# Patient Record
Sex: Male | Born: 1975 | Race: White | Hispanic: No | Marital: Single | Smoking: Current every day smoker
Health system: Southern US, Community
[De-identification: ages and names within clinical notes are randomized; demographics above are authoritative.]

## PROBLEM LIST (undated history)

## (undated) DIAGNOSIS — N189 Chronic kidney disease, unspecified: Secondary | ICD-10-CM

## (undated) DIAGNOSIS — Z720 Tobacco use: Secondary | ICD-10-CM

## (undated) DIAGNOSIS — K219 Gastro-esophageal reflux disease without esophagitis: Secondary | ICD-10-CM

## (undated) HISTORY — PX: ORIF TIBIA PLATEAU: SHX2132

## (undated) HISTORY — PX: APPENDECTOMY: SHX54

---

## 1998-11-11 ENCOUNTER — Emergency Department (HOSPITAL_COMMUNITY): Admission: EM | Admit: 1998-11-11 | Discharge: 1998-11-11 | Payer: Self-pay | Admitting: Emergency Medicine

## 1998-11-11 ENCOUNTER — Encounter: Payer: Self-pay | Admitting: Emergency Medicine

## 1998-12-18 ENCOUNTER — Encounter: Payer: Self-pay | Admitting: Emergency Medicine

## 1998-12-18 ENCOUNTER — Emergency Department (HOSPITAL_COMMUNITY): Admission: EM | Admit: 1998-12-18 | Discharge: 1998-12-18 | Payer: Self-pay | Admitting: Emergency Medicine

## 1999-11-02 ENCOUNTER — Emergency Department (HOSPITAL_COMMUNITY): Admission: EM | Admit: 1999-11-02 | Discharge: 1999-11-02 | Payer: Self-pay | Admitting: Emergency Medicine

## 2001-06-13 ENCOUNTER — Emergency Department (HOSPITAL_COMMUNITY): Admission: EM | Admit: 2001-06-13 | Discharge: 2001-06-13 | Payer: Self-pay | Admitting: Emergency Medicine

## 2001-06-17 ENCOUNTER — Emergency Department (HOSPITAL_COMMUNITY): Admission: EM | Admit: 2001-06-17 | Discharge: 2001-06-17 | Payer: Self-pay | Admitting: Emergency Medicine

## 2003-10-22 ENCOUNTER — Emergency Department (HOSPITAL_COMMUNITY): Admission: EM | Admit: 2003-10-22 | Discharge: 2003-10-22 | Payer: Self-pay | Admitting: Emergency Medicine

## 2008-01-05 ENCOUNTER — Emergency Department (HOSPITAL_COMMUNITY): Admission: EM | Admit: 2008-01-05 | Discharge: 2008-01-05 | Payer: Self-pay | Admitting: Emergency Medicine

## 2008-01-09 ENCOUNTER — Emergency Department (HOSPITAL_COMMUNITY): Admission: EM | Admit: 2008-01-09 | Discharge: 2008-01-09 | Payer: Self-pay | Admitting: Emergency Medicine

## 2011-10-30 ENCOUNTER — Emergency Department (HOSPITAL_COMMUNITY)
Admission: EM | Admit: 2011-10-30 | Discharge: 2011-10-30 | Disposition: A | Discharge status: Home / Self Care | Payer: Self-pay | Attending: Emergency Medicine | Admitting: Emergency Medicine

## 2011-10-30 ENCOUNTER — Encounter (HOSPITAL_COMMUNITY): Payer: Self-pay | Admitting: *Deleted

## 2011-10-30 ENCOUNTER — Emergency Department (HOSPITAL_COMMUNITY): Payer: Self-pay

## 2011-10-30 DIAGNOSIS — M25519 Pain in unspecified shoulder: Secondary | ICD-10-CM | POA: Insufficient documentation

## 2011-10-30 DIAGNOSIS — X500XXA Overexertion from strenuous movement or load, initial encounter: Secondary | ICD-10-CM | POA: Insufficient documentation

## 2011-10-30 DIAGNOSIS — S161XXA Strain of muscle, fascia and tendon at neck level, initial encounter: Secondary | ICD-10-CM

## 2011-10-30 DIAGNOSIS — W06XXXA Fall from bed, initial encounter: Secondary | ICD-10-CM | POA: Insufficient documentation

## 2011-10-30 DIAGNOSIS — S139XXA Sprain of joints and ligaments of unspecified parts of neck, initial encounter: Secondary | ICD-10-CM | POA: Insufficient documentation

## 2011-10-30 DIAGNOSIS — M542 Cervicalgia: Secondary | ICD-10-CM | POA: Insufficient documentation

## 2011-10-30 MED ORDER — PREDNISONE 50 MG PO TABS: ORAL_TABLET | ORAL | Status: AC

## 2011-10-30 MED ORDER — CYCLOBENZAPRINE HCL 10 MG PO TABS: ORAL_TABLET | ORAL | Status: DC

## 2011-10-30 MED ORDER — PREDNISONE 20 MG PO TABS
60.0000 mg | ORAL_TABLET | Freq: Once | ORAL | Status: AC
  Administered 2011-10-30: 60 mg via ORAL
  Filled 2011-10-30: qty 3

## 2011-10-30 MED ORDER — CYCLOBENZAPRINE HCL 10 MG PO TABS
10.0000 mg | ORAL_TABLET | Freq: Once | ORAL | Status: AC
  Administered 2011-10-30: 10 mg via ORAL
  Filled 2011-10-30: qty 1

## 2011-10-30 NOTE — ED Provider Notes (Signed)
History     CSN: [id number]  Arrival date & time 10/30/11  4098   First MD Initiated Contact with Patient 10/30/11 1030      Chief Complaint  Patient presents with  . Neck Pain    (Consider location/radiation/quality/duration/timing/severity/associated sxs/prior treatment) Patient is a 36 y.o. Mclaughlin presenting with neck pain. The history is provided by the patient. No language interpreter was used.  Neck Pain  This is a new problem. Episode onset: 3 days ago. The problem occurs constantly. The problem has been gradually worsening. The pain is associated with lifting a heavy object (initial neck pain started when he attempted sitting on his bed and "missed it".  struck floor with buttocks and "whiplashed my neck".  pain is worse since lifting a heavy appliance at work yest.). There has been no fever. The pain is present in the right side. The quality of the pain is described as shooting and aching. The pain radiates to the right scapula and right shoulder. The pain is severe. The symptoms are aggravated by bending and twisting. He has tried NSAIDs for the symptoms. The treatment provided no relief.    History reviewed. No pertinent past medical history.  Past Surgical History  Procedure Date  . Appendectomy     History reviewed. No pertinent family history.  History  Substance Use Topics  . Smoking status: Current Everyday Smoker -- 2.0 packs/day    Types: Cigarettes  . Smokeless tobacco: Not on file  . Alcohol Use: Yes      Review of Systems  HENT: Positive for neck pain.   Musculoskeletal:       Neck pain  All other systems reviewed and are negative.    Allergies  Review of patient's allergies indicates no known allergies.  Home Medications   Current Outpatient Rx  Name Route Sig Dispense Refill  . NAPROXEN SODIUM 220 MG PO TABS Oral Take 220 mg by mouth 2 (two) times daily as needed. Pain    . CYCLOBENZAPRINE HCL 10 MG PO TABS  1/2 to one tab po TID for muscle  strain 20 tablet 0  . PREDNISONE 50 MG PO TABS  One po QD 6 tablet 0    BP 118/71  Pulse 78  Temp(Src) 98.2 F (36.8 C) (Oral)  Resp 20  Ht 6\' 1"  (1.854 m)  Wt 180 lb (81.647 kg)  BMI 23.75 kg/m2  SpO2 99%  Physical Exam  Nursing note and vitals reviewed. Constitutional: He is oriented to person, place, and time. He appears well-developed and well-nourished.  HENT:  Head: Normocephalic and atraumatic.  Eyes: EOM are normal.  Neck: Muscular tenderness present. No spinous process tenderness present. No rigidity. Decreased range of motion present.    Cardiovascular: Normal rate, regular rhythm, normal heart sounds and intact distal pulses.   Pulmonary/Chest: Effort normal and breath sounds normal. No respiratory distress.  Abdominal: Soft. He exhibits no distension. There is no tenderness.  Musculoskeletal: He exhibits tenderness.  Neurological: He is alert and oriented to person, place, and time. He has normal strength. He displays normal reflexes. No cranial nerve deficit or sensory deficit. Coordination normal. GCS eye subscore is 4. GCS verbal subscore is 5. GCS motor subscore is 6.  Reflex Scores:      Tricep reflexes are 2+ on the right side and 2+ on the left side.      Bicep reflexes are 2+ on the right side and 2+ on the left side.      Brachioradialis  reflexes are 2+ on the right side and 2+ on the left side. Skin: Skin is warm and dry.  Psychiatric: He has a normal mood and affect. Judgment normal.    ED Course  Procedures (including critical care time)  Labs Reviewed - No data to display Dg Cervical Spine Complete  10/30/2011  *RADIOLOGY REPORT*  Clinical Data: Posterior neck pain for 1 week.  Had whiplash type injury 1 week ago.  CERVICAL SPINE - COMPLETE 4+ VIEW  Comparison: None.  Findings: There is reversal of the normal cervical lordosis.  The cervical spine vertebral bodies are normally aligned from the skull base through the T2 vertebral body.  Vertebral  bodies are normal in height.  The facet joints are aligned.  The disc spaces are maintained and the neural foramen are patent bilaterally.  Lateral masses of C1-C2 are aligned.  No acute fracture is identified. Prevertebral soft tissue contour is within normal limits.  The tracheal column is unremarkable.  IMPRESSION:  1.  Reversal of the normal cervical lordosis.  This can be seen in the setting of muscle spasm. 2.  No acute bony abnormality identified.  Original Report Authenticated By: Britta Mccreedy, M.D.     1. Cervical strain       MDM  rx prednisone rx flexeril Ice F/u with dr. Romeo Apple prn        Worthy Rancher, PA 10/30/11 1220  Worthy Rancher, PA 10/30/11 1220
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
## 2011-10-30 NOTE — Discharge Instructions (Signed)
Cryotherapy Cryotherapy means treatment with cold. Ice or gel packs can be used to reduce both pain and swelling. Ice is the most helpful within the first 24 to 48 hours after an injury or flareup from overusing a muscle or joint. Sprains, strains, spasms, burning pain, shooting pain, and aches can all be eased with ice. Ice can also be used when recovering from surgery. Ice is effective, has very few side effects, and is safe for most people to use. PRECAUTIONS  Ice is not a safe treatment option for people with:  Raynaud's phenomenon. This is a condition affecting small blood vessels in the extremities. Exposure to cold may cause your problems to return.   Cold hypersensitivity. There are many forms of cold hypersensitivity, including:   Cold urticaria. Red, itchy hives appear on the skin when the tissues begin to warm after being iced.   Cold erythema. This is a red, itchy rash caused by exposure to cold.   Cold hemoglobinuria. Red blood cells break down when the tissues begin to warm after being iced. The hemoglobin that carry oxygen are passed into the urine because they cannot combine with blood proteins fast enough.   Numbness or altered sensitivity in the area being iced.  If you have any of the following conditions, do not use ice until you have discussed cryotherapy with your caregiver:  Heart conditions, such as arrhythmia, angina, or chronic heart disease.   High blood pressure.   Healing wounds or open skin in the area being iced.   Current infections.   Rheumatoid arthritis.   Poor circulation.   Diabetes.  Ice slows the blood flow in the region it is applied. This is beneficial when trying to stop inflamed tissues from spreading irritating chemicals to surrounding tissues. However, if you expose your skin to cold temperatures for too long or without the proper protection, you can damage your skin or nerves. Watch for signs of skin damage due to cold. HOME CARE  INSTRUCTIONS Follow these tips to use ice and cold packs safely.  Place a dry or damp towel between the ice and skin. A damp towel will cool the skin more quickly, so you may need to shorten the time that the ice is used.   For a more rapid response, add gentle compression to the ice.   Ice for no more than 10 to 20 minutes at a time. The bonier the area you are icing, the less time it will take to get the benefits of ice.   Check your skin after 5 minutes to make sure there are no signs of a poor response to cold or skin damage.   Rest 20 minutes or more in between uses.   Once your skin is numb, you can end your treatment. You can test numbness by very lightly touching your skin. The touch should be so light that you do not see the skin dimple from the pressure of your fingertip. When using ice, most people will feel these normal sensations in this order: cold, burning, aching, and numbness.   Do not use ice on someone who cannot communicate their responses to pain, such as small children or people with dementia.  HOW TO MAKE AN ICE PACK Ice packs are the most common way to use ice therapy. Other methods include ice massage, ice baths, and cryo-sprays. Muscle creams that cause a cold, tingly feeling do not offer the same benefits that ice offers and should not be used as a substitute  unless recommended by your caregiver. To make an ice pack, do one of the following:  Place crushed ice or a bag of frozen vegetables in a sealable plastic bag. Squeeze out the excess air. Place this bag inside another plastic bag. Slide the bag into a pillowcase or place a damp towel between your skin and the bag.   Mix 3 parts water with 1 part rubbing alcohol. Freeze the mixture in a sealable plastic bag. When you remove the mixture from the freezer, it will be slushy. Squeeze out the excess air. Place this bag inside another plastic bag. Slide the bag into a pillowcase or place a damp towel between your skin  and the bag.  SEEK MEDICAL CARE IF:  You develop white spots on your skin. This may give the skin a blotchy (mottled) appearance.   Your skin turns blue or pale.   Your skin becomes waxy or hard.   Your swelling gets worse.  MAKE SURE YOU:   Understand these instructions.   Will watch your condition.   Will get help right away if you are not doing well or get worse.  Document Released: 03/26/2011 Document Revised: 07/19/2011 Document Reviewed: 03/26/2011 [facility] Patient Information 2012 [place], [place].Cervical Strain Care After A cervical strain is when the muscles and ligaments in your neck have been stretched. The bones are not broken. If you had any problems moving your arms or legs immediately after the injury, even if the problem has gone away, make sure to tell this to your caregiver.  HOME CARE INSTRUCTIONS   While awake, apply ice packs to the neck or areas of pain about every 1 to 2 hours, for 15 to 20 minutes at a time. Do this for 2 days. If you were given a cervical collar for support, ask your caregiver if you may remove it for bathing or applying ice.   If given a cervical collar, wear as instructed. Do not remove any collar unless instructed by a caregiver.   Only take over-the-counter or prescription medicines for pain, discomfort, or fever as directed by your caregiver.  Recheck with the hospital or clinic after a radiologist has read your X-rays. Recheck with the hospital or clinic to make sure the initial readings are correct. Do this also to determine if you need further studies. It is your responsibility to find out your X-ray results. X-rays are sometimes repeated in one week to ten days. These are often repeated to make sure that a hairline fracture was not overlooked. Ask your caregiver how you are to find out about your radiology (X-ray) results. SEEK IMMEDIATE MEDICAL CARE IF:   You have increasing pain in your neck.   You develop difficulties swallowing  or breathing.   You have numbness, weakness, or movement problems in the arms or legs.   You have difficulty walking.   You develop bowel or bladder retention or incontinence.   You have problems with walking.  MAKE SURE YOU:   Understand these instructions.   Will watch your condition.   Will get help right away if you are not doing well or get worse.  Document Released: 07/30/2005 Document Revised: 04/11/2011 Document Reviewed: 03/12/2008 [facility] Patient Information 2012 [place], [place].   Take the meds as directed.  Do NOT take any NSAID's  While taking the prednisone.  Apply ice 10-15 min several times daily.  Follow up with dr. Romeo Apple if not improving.

## 2011-10-30 NOTE — ED Notes (Signed)
Pt states missed sitting on bed and fell on floor landing on buttocks hurting neck. Pt states neck did not improve over the past week.  Pt re-injured neck today when moving a refrigerator.  Pt states hurts to move neck to the right, and increased pain with flexion. Pt transported to xray.

## 2011-10-30 NOTE — ED Provider Notes (Signed)
Medical screening examination/treatment/procedure(s) were performed by non-physician practitioner and as supervising physician I was immediately available for consultation/collaboration.  Juliet Rude. Rubin Payor, MD 10/30/11 1456

## 2014-05-01 ENCOUNTER — Encounter (HOSPITAL_COMMUNITY): Payer: Self-pay | Admitting: Emergency Medicine

## 2014-05-01 ENCOUNTER — Inpatient Hospital Stay: Payer: Self-pay | Admitting: Orthopedic Surgery

## 2014-05-01 ENCOUNTER — Emergency Department (HOSPITAL_COMMUNITY)
Admission: EM | Admit: 2014-05-01 | Discharge: 2014-05-01 | Discharge status: Against Medical Advice | Payer: Medicaid Other | Attending: Emergency Medicine | Admitting: Emergency Medicine

## 2014-05-01 DIAGNOSIS — S99929A Unspecified injury of unspecified foot, initial encounter: Secondary | ICD-10-CM | POA: Diagnosis present

## 2014-05-01 DIAGNOSIS — S8990XA Unspecified injury of unspecified lower leg, initial encounter: Secondary | ICD-10-CM | POA: Insufficient documentation

## 2014-05-01 DIAGNOSIS — S99919A Unspecified injury of unspecified ankle, initial encounter: Principal | ICD-10-CM

## 2014-05-01 DIAGNOSIS — F17200 Nicotine dependence, unspecified, uncomplicated: Secondary | ICD-10-CM | POA: Diagnosis not present

## 2014-05-01 DIAGNOSIS — M25562 Pain in left knee: Secondary | ICD-10-CM

## 2014-05-01 HISTORY — DX: Tobacco use: Z72.0

## 2014-05-01 LAB — COMPREHENSIVE METABOLIC PANEL
ALT: 51 U/L
ANION GAP: 12 (ref 7–16)
Albumin: 4.2 g/dL (ref 3.4–5.0)
Alkaline Phosphatase: 121 U/L — ABNORMAL HIGH
BILIRUBIN TOTAL: 0.9 mg/dL (ref 0.2–1.0)
BUN: 13 mg/dL (ref 7–18)
Calcium, Total: 8.7 mg/dL (ref 8.5–10.1)
Chloride: 107 mmol/L (ref 98–107)
Co2: 20 mmol/L — ABNORMAL LOW (ref 21–32)
Creatinine: 1.14 mg/dL (ref 0.60–1.30)
EGFR (Non-African Amer.): >60
GLUCOSE: 88 mg/dL (ref 65–99)
OSMOLALITY: 277 (ref 275–301)
POTASSIUM: 3.6 mmol/L (ref 3.5–5.1)
SGOT(AST): 33 U/L (ref 15–37)
SODIUM: 139 mmol/L (ref 136–145)
Total Protein: 7.7 g/dL (ref 6.4–8.2)

## 2014-05-01 LAB — CBC
HCT: 54 % — ABNORMAL HIGH (ref 40.0–52.0)
HGB: 17.8 g/dL (ref 13.0–18.0)
MCH: 29.8 pg (ref 26.0–34.0)
MCHC: 32.9 g/dL (ref 32.0–36.0)
MCV: 91 fL (ref 80–100)
Platelet: 321 10*3/uL (ref 150–440)
RBC: 5.97 10*6/uL — AB (ref 4.40–5.90)
RDW: 13.7 % (ref 11.5–14.5)
WBC: 21.6 10*3/uL — ABNORMAL HIGH (ref 3.8–10.6)

## 2014-05-01 MED ORDER — OXYCODONE-ACETAMINOPHEN 5-325 MG PO TABS
1.0000 | ORAL_TABLET | Freq: Once | ORAL | Status: DC
  Filled 2014-05-01: qty 1
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
## 2014-05-05 LAB — BASIC METABOLIC PANEL
ANION GAP: 6 — AB (ref 7–16)
BUN: 6 mg/dL — AB (ref 7–18)
Calcium, Total: 8 mg/dL — ABNORMAL LOW (ref 8.5–10.1)
Chloride: 104 mmol/L (ref 98–107)
Co2: 28 mmol/L (ref 21–32)
Creatinine: 1.08 mg/dL (ref 0.60–1.30)
EGFR (African American): >60
EGFR (Non-African Amer.): >60
Glucose: 136 mg/dL — ABNORMAL HIGH (ref 65–99)
OSMOLALITY: 275 (ref 275–301)
Potassium: 3.5 mmol/L (ref 3.5–5.1)
Sodium: 138 mmol/L (ref 136–145)

## 2014-05-05 LAB — HEMOGLOBIN: HGB: 14.3 g/dL (ref 13.0–18.0)

## 2014-12-04 NOTE — Op Note (Signed)
PATIENT NAME:  Andre Mclaughlin, [name] MR#:  [id number] DATE OF BIRTH:  October 29, 1975  DATE OF PROCEDURE:  05/04/2014  PREOPERATIVE DIAGNOSIS: Left lateral tibial plateau fracture, comminuted.   POSTOPERATIVE DIAGNOSIS: Left lateral tibial plateau fracture, comminuted.   PROCEDURE: Open reduction internal fixation, left lateral tibial plateau with removal of prior external fixator.   ANESTHESIA: General.   SURGEON: Leitha SchullerMichael J. Tramon Crescenzo, MD   DESCRIPTION OF PROCEDURE: The patient was brought to the operating room and after adequate anesthesia was obtained, the left leg was prepped and draped in the usual sterile fashion with a tourniquet applied to the upper thigh. After patient identification and timeout procedures were completed, the tourniquet was raised to 300 mmHg.   A curvilinear incision was made at the proximal lateral tibia, the incision down through the skin and subcutaneous tissue developing thick flaps lateral to the patellar tendon. The IT band was elevated off its attachment to expose the lateral aspect of the proximal tibia. The split laterally was opened to provide exposure to the depressed fragments. A sub meniscal arthrotomy was also carried out to inspect the joint. There was a very large depressed fragment of the articular fragment, which was elevated and pinned in place. On the C-arm, it appeared to be essentially anatomically reduced. A lateral tibial plate was then applied and slid in  subcutaneously along the lateral aspect of the tibial shaft. Care was taken to get this aligned appropriately with rotation as well as distance from the joint line. The plate was pinned in place distally and then nonlocking screws were initially used to bring the plate down to the bone and get good compression at the fracture site. Next, multiple locking screws were placed proximally, drilling, measuring and placing the locking screws. The cortical screws were placed through percutaneous stab incisions and the  plate came down close to the shaft, but not completely as it was just acting as buttress. The oblique kickstand screws were then placed without difficulty. The defect in the bone where the bone had been crushed was filled with Cerament bone void filler, approximately 5 mL, with no extravasation into the joint. At this point, the arthrotomy was repaired using a #1 Vicryl, and the anterolateral musculature was repaired over the plate in a similar fashion using the 0 Vicryl in a running manner, 2-0 Vicryl subcutaneously and skin staples applied.  The prior external fixator was removed. The knee placed through range of motion and was stable. The wounds were dressed with Xeroform, 4 x 4's, ABD, Webril and Ace wrap. The patient was sent to recovery room in stable condition.   ESTIMATED BLOOD LOSS: 100 mL.   COMPLICATIONS: None.   SPECIMEN: None.   IMPLANTS: Biomet ALPS, lateral tibial plateaus set with multiple screws.   TOURNIQUET TIME: 90 minutes at 300 mmHg.   ____________________________ Leitha SchullerMichael J. Olden Klauer, MD mjm:LT D: 05/04/2014 21:19:05 ET T: 05/04/2014 22:10:33 ET JOB#: [id number]  cc: Leitha SchullerMichael J. Bryen Hinderman, MD, <Dictator> Leitha SchullerMICHAEL J Jatin Naumann MD ELECTRONICALLY SIGNED 05/05/2014 7:17
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
## 2014-12-04 NOTE — Op Note (Signed)
PATIENT NAME:  Andre Mclaughlin, Param MR#:  [id number] DATE OF BIRTH:  05-Jun-1976  DATE OF PROCEDURE:  05/01/2014  PREOPERATIVE DIAGNOSIS: Comminuted left lateral tibial plateau fracture.   POSTOPERATIVE DIAGNOSIS: Comminuted left lateral tibial plateau fracture.   PROCEDURE: Closed reduction with application of spanning external fixator, left lower extremity.   ANESTHESIA: General.   SURGEON: Leitha SchullerMichael J. Lashala Laser, MD  DESCRIPTION OF PROCEDURE: The patient was brought to the operating room, and after adequate anesthesia was obtained, the left leg was prepped and draped in the usual sterile fashion with no tourniquet being required. After prepping and draping, appropriate patient identification and timeout procedures were completed. C-arm was used for evaluation of pin location. The femoral pins were first placed using the Zimmer XtraFix large external fixator frame. With the proximal clamp held in position and 2 trocars in place, appropriate position for the pin sites was determined and 2 small stab incision were made and the trocar was brought down to the bone, and the self-tapping threaded pins inserted across the femur just into the distal cortex checking on lateral view. Next, identical procedure was carried distally with 2 shorter pins, the same diameter, in the tibia going just medial to the tibia crest to avoid neurovascular structures. After these pins had been placed and the clamps applied, long carbon fiber tubes were placed and with significant traction applied by me along with varus stress, the lateral side was decompressed and pressure taken off of it to try decompress it and get ligamentotaxis and help reduce the fracture. The scrub tightened the clamps on the fixator to maintain this position. The wounds were then dressed with Xeroform and 4 x 4's. The knee was aspirated to aid in patient comfort with 100 mL of bloody fluid withdrawn, with marrow present. The patient tolerated the procedure well  and was sent to the recovery room in stable condition.   ESTIMATED BLOOD LOSS: Minimal.   COMPLICATIONS: None.   SPECIMEN: None.  IMPLANT: The external fixator, left leg, from distal femur to distal tibia.    ____________________________ Leitha SchullerMichael J. Shellyann Wandrey, MD mjm:ST D: 05/01/2014 21:21:46 ET T: 05/02/2014 00:04:47 ET JOB#: [id number]  cc: Leitha SchullerMichael J. Vue Pavon, MD, <Dictator> Leitha SchullerMICHAEL J Bobbi Kozakiewicz MD ELECTRONICALLY SIGNED 05/02/2014 8:15
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
## 2015-02-11 ENCOUNTER — Ambulatory Visit: Payer: Self-pay | Admitting: Family Medicine

## 2015-02-15 ENCOUNTER — Telehealth: Payer: Self-pay

## 2015-02-15 ENCOUNTER — Encounter: Payer: Self-pay | Admitting: Family Medicine

## 2015-02-15 ENCOUNTER — Ambulatory Visit (INDEPENDENT_AMBULATORY_CARE_PROVIDER_SITE_OTHER): Payer: Medicaid Other | Admitting: Family Medicine

## 2015-02-15 VITALS — BP 106/68 | HR 64 | Resp 18 | Ht 72.0 in | Wt 176.6 lb

## 2015-02-15 DIAGNOSIS — S82143A Displaced bicondylar fracture of unspecified tibia, initial encounter for closed fracture: Secondary | ICD-10-CM | POA: Insufficient documentation

## 2015-02-15 DIAGNOSIS — S82142S Displaced bicondylar fracture of left tibia, sequela: Secondary | ICD-10-CM

## 2015-02-15 DIAGNOSIS — Z0000 Encounter for general adult medical examination without abnormal findings: Secondary | ICD-10-CM

## 2015-02-15 DIAGNOSIS — Z720 Tobacco use: Secondary | ICD-10-CM | POA: Diagnosis not present

## 2015-02-15 DIAGNOSIS — F17200 Nicotine dependence, unspecified, uncomplicated: Secondary | ICD-10-CM

## 2015-02-15 MED ORDER — TETANUS-DIPHTH-ACELL PERTUSSIS 5-2.5-18.5 LF-MCG/0.5 IM SUSP
0.5000 mL | Freq: Once | INTRAMUSCULAR | Status: AC
  Administered 2015-02-15: 0.5 mL via INTRAMUSCULAR

## 2015-02-15 MED ORDER — TRAMADOL HCL 50 MG PO TABS: 50.0000 mg | ORAL_TABLET | Freq: Four times a day (QID) | ORAL | Status: DC | PRN

## 2015-02-15 NOTE — Telephone Encounter (Signed)
Patient is eligible for benefits through Medicaid through August with [facility]. Patient has not received card in the mail - should be mailed out in the next few weeks.

## 2015-02-15 NOTE — Progress Notes (Signed)
Date:  02/15/2015   Name:  Andre Mclaughlin   DOB:  [birth year]   MRN:  [id number]  PCP:  Schuyler AmorWilliam Chelcie Estorga, MD    Chief Complaint: Knee Pain   History of Present Illness:  This is a 39 y.o. male [name] s/p ORIF 1 year ago, had postop infection but otherwise no complications, has had increasing pain, now scheduled for hardware removal [facility], requests pain medicine to get through to surgery, taking multiple Aleve per day. Smoker, understands need to quit, not ready yet.  Review of Systems:  Review of Systems  Constitutional: Negative for fever.  HENT: Negative for ear pain and sore throat.   Eyes: Negative for pain.  Respiratory: Negative for shortness of breath.   Cardiovascular: Negative for chest pain and leg swelling.  Gastrointestinal: Negative for abdominal pain.  Genitourinary: Negative for difficulty urinating.  Musculoskeletal: Negative for back pain.  Skin: Negative for rash.  Neurological: Negative for syncope.  Psychiatric/Behavioral: Negative for confusion.    Patient Active Problem List   Diagnosis Date Noted  . Tibial plateau fracture 02/15/2015    Prior to Admission medications   Medication Sig Start Date End Date Taking? Authorizing Provider  naproxen sodium (RA NAPROXEN SODIUM) 220 MG tablet Take 2 tablets by mouth every 6 (six) hours as needed.    Historical Provider, MD  traMADol (ULTRAM) 50 MG tablet Take 1 tablet (50 mg total) by mouth every 6 (six) hours as needed for moderate pain. 02/15/15   Schuyler AmorWilliam Dashauna Heymann, MD    No Known Allergies  Past Surgical History  Procedure Laterality Date  . Appendectomy      History  Substance Use Topics  . Smoking status: Current Every Day Smoker -- 1.50 packs/day for 20 years    Types: Cigarettes  . Smokeless tobacco: Not on file  . Alcohol Use: 0.0 oz/week    0 Standard drinks or equivalent per week     Comment: occasional    No family history on file.  Medication list has  been reviewed and updated.  Physical Examination: BP 106/68 mmHg  Pulse 64  Resp 18  Ht 6' (1.829 m)  Wt 176 lb 9.6 oz (80.105 kg)  BMI 23.95 kg/m2  Physical Exam  Constitutional: He is oriented to person, place, and time. He appears well-developed and well-nourished.  Neck: Neck supple. No thyromegaly present.  Cardiovascular: Normal rate, regular rhythm and normal heart sounds.   Pulmonary/Chest: Effort normal and breath sounds normal.  Abdominal: Soft. He exhibits no distension and no mass. There is no tenderness.  Musculoskeletal:  Healed scars over L knee and upper leg, no erythema or edema  Lymphadenopathy:    He has no cervical adenopathy.  Neurological: He is alert and oriented to person, place, and time. Coordination normal.  Skin: Skin is warm and dry. No rash noted.  Psychiatric: He has a normal mood and affect. His behavior is normal.    Assessment and Plan:  1. Routine health maintenance - Tdap (BOOSTRIX) injection 0.5 mL; Inject 0.5 mLs into the muscle once.  2. Tibial plateau fracture, left, sequela For hardware removal next week, tramadol prn until then  3. Smoker Discussed quitting, not ready yet   Return in about 6 months (around 08/18/2015).  Dionne AnoWilliam M. Kingsley SpittlePlonk, Jr. MD [facility]  02/15/2015

## 2015-02-18 ENCOUNTER — Other Ambulatory Visit: Payer: Medicaid Other

## 2015-02-18 ENCOUNTER — Encounter: Payer: Self-pay | Admitting: *Deleted

## 2015-02-18 DIAGNOSIS — F17200 Nicotine dependence, unspecified, uncomplicated: Secondary | ICD-10-CM | POA: Diagnosis not present

## 2015-02-18 DIAGNOSIS — T8484XA Pain due to internal orthopedic prosthetic devices, implants and grafts, initial encounter: Secondary | ICD-10-CM | POA: Diagnosis present

## 2015-02-18 DIAGNOSIS — K219 Gastro-esophageal reflux disease without esophagitis: Secondary | ICD-10-CM | POA: Diagnosis not present

## 2015-02-18 NOTE — Patient Instructions (Signed)
  Your procedure is scheduled on: 02-22-15 Report to [facility] To find out your arrival time please call [phone] between 1PM - 3PM on 02-21-15  Remember: Instructions that are not followed completely may result in serious medical risk, up to and including death, or upon the discretion of your surgeon and anesthesiologist your surgery may need to be rescheduled.    _X___ 1. Do not eat food or drink liquids after midnight. No gum chewing or hard candies.     _X___ 2. No Alcohol for 24 hours before or after surgery.   ____ 3. Bring all medications with you on the day of surgery if instructed.    ____ 4. Notify your doctor if there is any change in your medical condition     (cold, fever, infections).     Do not wear jewelry, make-up, hairpins, clips or nail polish.  Do not wear lotions, powders, or perfumes. You may wear deodorant.  Do not shave 48 hours prior to surgery. Men may shave face and neck.  Do not bring valuables to the hospital.    [facility] any belongings or valuables.               Contacts, dentures or bridgework may not be worn into surgery.  Leave your suitcase in the car. After surgery it may be brought to your room.  For patients admitted to the hospital, discharge time is determined by your      treatment team.   Patients discharged the day of surgery will not be allowed to drive home.   Please read over the following fact sheets that you were given:      _X___ Take these medicines the morning of surgery with A SIP OF WATER:    1. PEPCID  2. TAKE PEPCID Monday NIGHT  3.   4.  5.  6.  ____ Fleet Enema (as directed)   ____ Use CHG Soap as directed  ____ Use inhalers on the day of surgery  ____ Stop metformin 2 days prior to surgery    ____ Take 1/2 of usual insulin dose the night before surgery and none on the morning of surgery.   ____ Stop Coumadin/Plavix/aspirin-N/A  __X__ Stop  Anti-inflammatories-STOP ALEVE NOW-NO NSAIDS OR ASA PRODUCTS-TRAMADOL/TYLENOL OK   ____ Stop supplements until after surgery.    ____ Bring C-Pap to the hospital.

## 2015-02-22 ENCOUNTER — Ambulatory Visit: Payer: Medicaid Other | Admitting: Anesthesiology

## 2015-02-22 ENCOUNTER — Encounter
Admission: RE | Disposition: A | Discharge status: Home / Self Care | Payer: Self-pay | Source: Ambulatory Visit | Attending: Orthopedic Surgery

## 2015-02-22 ENCOUNTER — Ambulatory Visit: Payer: Medicaid Other

## 2015-02-22 ENCOUNTER — Encounter: Payer: Self-pay | Admitting: Anesthesiology

## 2015-02-22 ENCOUNTER — Ambulatory Visit
Admission: RE | Admit: 2015-02-22 | Discharge: 2015-02-22 | Disposition: A | Discharge status: Home / Self Care | Payer: Medicaid Other | Source: Ambulatory Visit | Attending: Orthopedic Surgery | Admitting: Orthopedic Surgery

## 2015-02-22 DIAGNOSIS — Z419 Encounter for procedure for purposes other than remedying health state, unspecified: Secondary | ICD-10-CM

## 2015-02-22 DIAGNOSIS — T8484XA Pain due to internal orthopedic prosthetic devices, implants and grafts, initial encounter: Secondary | ICD-10-CM | POA: Insufficient documentation

## 2015-02-22 DIAGNOSIS — K219 Gastro-esophageal reflux disease without esophagitis: Secondary | ICD-10-CM | POA: Insufficient documentation

## 2015-02-22 DIAGNOSIS — F17200 Nicotine dependence, unspecified, uncomplicated: Secondary | ICD-10-CM | POA: Insufficient documentation

## 2015-02-22 HISTORY — PX: HARDWARE REMOVAL: SHX979

## 2015-02-22 HISTORY — DX: Chronic kidney disease, unspecified: N18.9

## 2015-02-22 HISTORY — DX: Gastro-esophageal reflux disease without esophagitis: K21.9

## 2015-02-22 SURGERY — REMOVAL, HARDWARE
Anesthesia: General | Site: Leg Lower | Laterality: Left | Wound class: Clean

## 2015-02-22 MED ORDER — NEOMYCIN-POLYMYXIN B GU 40-200000 IR SOLN
Status: AC
  Filled 2015-02-22: qty 4

## 2015-02-22 MED ORDER — CEFAZOLIN SODIUM-DEXTROSE 2-3 GM-% IV SOLR: 2.0000 g | Freq: Once | INTRAVENOUS | Status: DC

## 2015-02-22 MED ORDER — FENTANYL CITRATE (PF) 100 MCG/2ML IJ SOLN: 25.0000 ug | INTRAMUSCULAR | Status: DC | PRN

## 2015-02-22 MED ORDER — KETOROLAC TROMETHAMINE 0.5 % OP SOLN: OPHTHALMIC | Status: DC | PRN

## 2015-02-22 MED ORDER — SUCCINYLCHOLINE CHLORIDE 20 MG/ML IJ SOLN
INTRAMUSCULAR | Status: DC | PRN
  Administered 2015-02-22: 100 mg via INTRAVENOUS

## 2015-02-22 MED ORDER — LIDOCAINE HCL (CARDIAC) 20 MG/ML IV SOLN
INTRAVENOUS | Status: DC | PRN
  Administered 2015-02-22: 50 mg via INTRAVENOUS

## 2015-02-22 MED ORDER — ONDANSETRON HCL 4 MG/2ML IJ SOLN
4.0000 mg | Freq: Four times a day (QID) | INTRAMUSCULAR | Status: DC | PRN
Start: 2015-02-22 — End: 2015-02-22

## 2015-02-22 MED ORDER — KETOROLAC TROMETHAMINE 30 MG/ML IJ SOLN
INTRAMUSCULAR | Status: DC | PRN
  Administered 2015-02-22: 30 mg via INTRAVENOUS

## 2015-02-22 MED ORDER — METOCLOPRAMIDE HCL 5 MG/ML IJ SOLN: 5.0000 mg | Freq: Three times a day (TID) | INTRAMUSCULAR | Status: DC | PRN

## 2015-02-22 MED ORDER — ONDANSETRON HCL 4 MG PO TABS: 4.0000 mg | ORAL_TABLET | Freq: Four times a day (QID) | ORAL | Status: DC | PRN

## 2015-02-22 MED ORDER — FENTANYL CITRATE (PF) 100 MCG/2ML IJ SOLN
25.0000 ug | INTRAMUSCULAR | Status: AC | PRN
  Administered 2015-02-22 (×6): 25 ug via INTRAVENOUS

## 2015-02-22 MED ORDER — PROPOFOL 10 MG/ML IV BOLUS
INTRAVENOUS | Status: DC | PRN
  Administered 2015-02-22: 200 mg via INTRAVENOUS

## 2015-02-22 MED ORDER — HYDROCODONE-ACETAMINOPHEN 5-325 MG PO TABS: 1.0000 | ORAL_TABLET | Freq: Four times a day (QID) | ORAL | Status: DC | PRN

## 2015-02-22 MED ORDER — HYDROCODONE-ACETAMINOPHEN 5-325 MG PO TABS
1.0000 | ORAL_TABLET | ORAL | Status: DC | PRN
  Administered 2015-02-22: 1 via ORAL

## 2015-02-22 MED ORDER — FENTANYL CITRATE (PF) 100 MCG/2ML IJ SOLN
INTRAMUSCULAR | Status: DC | PRN
  Administered 2015-02-22: 100 ug via INTRAVENOUS

## 2015-02-22 MED ORDER — ONDANSETRON HCL 4 MG/2ML IJ SOLN: 4.0000 mg | Freq: Once | INTRAMUSCULAR | Status: DC | PRN

## 2015-02-22 MED ORDER — METOCLOPRAMIDE HCL 10 MG PO TABS: 5.0000 mg | ORAL_TABLET | Freq: Three times a day (TID) | ORAL | Status: DC | PRN

## 2015-02-22 MED ORDER — FENTANYL CITRATE (PF) 100 MCG/2ML IJ SOLN
INTRAMUSCULAR | Status: DC
Start: 2015-02-22 — End: 2015-02-22
  Filled 2015-02-22: qty 2

## 2015-02-22 MED ORDER — LACTATED RINGERS IV SOLN
INTRAVENOUS | Status: DC | PRN
  Administered 2015-02-22: 15:00:00 via INTRAVENOUS

## 2015-02-22 MED ORDER — LACTATED RINGERS IV SOLN
Freq: Once | INTRAVENOUS | Status: AC
  Administered 2015-02-22: 14:00:00 via INTRAVENOUS
  Administered 2015-02-22: 1000 mL via INTRAVENOUS

## 2015-02-22 MED ORDER — HYDROCODONE-ACETAMINOPHEN 5-325 MG PO TABS
ORAL_TABLET | ORAL | Status: AC
  Administered 2015-02-22: 1 via ORAL
  Filled 2015-02-22: qty 1

## 2015-02-22 MED ORDER — CEFAZOLIN SODIUM-DEXTROSE 2-3 GM-% IV SOLR
INTRAVENOUS | Status: AC
  Administered 2015-02-22: 2 g via INTRAVENOUS
  Filled 2015-02-22: qty 50

## 2015-02-22 MED ORDER — NEOMYCIN-POLYMYXIN B GU 40-200000 IR SOLN
Status: DC | PRN
  Administered 2015-02-22: 4 mL

## 2015-02-22 MED ORDER — ONDANSETRON HCL 4 MG/2ML IJ SOLN
INTRAMUSCULAR | Status: DC | PRN
  Administered 2015-02-22: 4 mg via INTRAVENOUS

## 2015-02-22 MED ORDER — SODIUM CHLORIDE 0.9 % IV SOLN: INTRAVENOUS | Status: DC

## 2015-02-22 MED ORDER — MIDAZOLAM HCL 2 MG/2ML IJ SOLN
INTRAMUSCULAR | Status: DC | PRN
  Administered 2015-02-22: 1 mg via INTRAVENOUS

## 2015-02-22 SURGICAL SUPPLY — 36 items
BNDG COHESIVE 4X5 TAN STRL (GAUZE/BANDAGES/DRESSINGS) ×3 IMPLANT
CANISTER SUCT 1200ML W/VALVE (MISCELLANEOUS) ×3 IMPLANT
CHLORAPREP W/TINT 26ML (MISCELLANEOUS) ×3 IMPLANT
DRAPE C-ARM XRAY 36X54 (DRAPES) ×3 IMPLANT
DRAPE INCISE IOBAN 66X45 STRL (DRAPES) ×3 IMPLANT
DRSG EMULSION OIL 3X8 NADH (GAUZE/BANDAGES/DRESSINGS) ×3 IMPLANT
ELECT CAUTERY BLADE 6.4 (BLADE) ×3 IMPLANT
GAUZE PETRO XEROFOAM 1X8 (MISCELLANEOUS) ×3 IMPLANT
GAUZE SPONGE 4X4 12PLY STRL (GAUZE/BANDAGES/DRESSINGS) ×3 IMPLANT
GLOVE BIOGEL PI IND STRL 9 (GLOVE) ×1 IMPLANT
GLOVE BIOGEL PI INDICATOR 9 (GLOVE) ×2
GLOVE SURG ORTHO 9.0 STRL STRW (GLOVE) ×3 IMPLANT
GOWN SPECIALTY ULTRA XL (MISCELLANEOUS) ×3 IMPLANT
GOWN STRL REUS W/ TWL LRG LVL3 (GOWN DISPOSABLE) ×1 IMPLANT
GOWN STRL REUS W/TWL LRG LVL3 (GOWN DISPOSABLE) ×2
GOWN STRL REUS W/TWL XL LVL4 (GOWN DISPOSABLE) ×3 IMPLANT
KIT RM TURNOVER STRD PROC AR (KITS) ×3 IMPLANT
NEEDLE FILTER BLUNT 18X 1/2SAF (NEEDLE) ×2
NEEDLE FILTER BLUNT 18X1 1/2 (NEEDLE) ×1 IMPLANT
NS IRRIG 1000ML POUR BTL (IV SOLUTION) ×3 IMPLANT
PACK EXTREMITY ARMC (MISCELLANEOUS) ×3 IMPLANT
PACK HIP COMPR (MISCELLANEOUS) IMPLANT
PAD ABD DERMACEA PRESS 5X9 (GAUZE/BANDAGES/DRESSINGS) ×6 IMPLANT
PAD GROUND ADULT SPLIT (MISCELLANEOUS) ×3 IMPLANT
PREP PVP WINGED SPONGE (MISCELLANEOUS) ×3 IMPLANT
STAPLER SKIN PROX 35W (STAPLE) ×3 IMPLANT
STOCKINETTE M/LG 89821 (MISCELLANEOUS) ×3 IMPLANT
SUT ETHIBOND NAB CT1 #1 30IN (SUTURE) ×3 IMPLANT
SUT ETHILON 3-0 FS-10 30 BLK (SUTURE) ×3
SUT VIC AB 0 CT1 36 (SUTURE) ×3 IMPLANT
SUT VIC AB 1 CTX 27 (SUTURE) ×6 IMPLANT
SUT VIC AB 2-0 CT1 27 (SUTURE) ×2
SUT VIC AB 2-0 CT1 TAPERPNT 27 (SUTURE) ×1 IMPLANT
SUTURE EHLN 3-0 FS-10 30 BLK (SUTURE) ×1 IMPLANT
SYRINGE 10CC LL (SYRINGE) ×3 IMPLANT
WATER STERILE IRR 1000ML POUR (IV SOLUTION) ×3 IMPLANT

## 2015-02-22 NOTE — Progress Notes (Signed)
Hardware given to pt.

## 2015-02-22 NOTE — H&P (Signed)
Reviewed paper H+P, will be scanned into chart. No changes noted.  

## 2015-02-22 NOTE — Transfer of Care (Signed)
Immediate Anesthesia Transfer of Care Note  Patient: Andre Mclaughlin  Procedure(s) Performed: Procedure(s): HARDWARE REMOVAL (Left)  Patient Location: PACU  Anesthesia Type:General  Level of Consciousness: awake, alert  and oriented  Airway & Oxygen Therapy: Patient Spontanous Breathing and Patient connected to face mask oxygen  Post-op Assessment: Report given to RN and Post -op Vital signs reviewed and stable  Post vital signs: Reviewed and stable  Last Vitals:  Filed Vitals:   02/22/15 1618  BP:   Pulse:   Temp: 36.6 C  Resp:     Complications: No apparent anesthesia complications

## 2015-02-22 NOTE — Anesthesia Postprocedure Evaluation (Signed)
  Anesthesia Post-op Note  Patient: Andre Mclaughlin  Procedure(s) Performed: Procedure(s): HARDWARE REMOVAL (Left)  Anesthesia type:General  Patient location: PACU  Post pain: Pain level controlled  Post assessment: Post-op Vital signs reviewed, Patient's Cardiovascular Status Stable, Respiratory Function Stable, Patent Airway and No signs of Nausea or vomiting  Post vital signs: Reviewed and stable  Last Vitals:  Filed Vitals:   02/22/15 1633  BP: 120/88  Pulse: 84  Temp:   Resp: 15    Level of consciousness: awake, alert  and patient cooperative  Complications: No apparent anesthesia complications

## 2015-02-22 NOTE — OR Nursing (Signed)
11 screws and 1 plate removed.

## 2015-02-22 NOTE — Anesthesia Preprocedure Evaluation (Addendum)
Anesthesia Evaluation  Patient identified by MRN, date of birth, ID band Patient awake    Reviewed: Allergy & Precautions, NPO status , Patient's Chart, lab work & pertinent test results  Airway Mallampati: II       Dental   Pulmonary Current Smoker,          Cardiovascular negative cardio ROS      Neuro/Psych negative neurological ROS  negative psych ROS   GI/Hepatic GERD-  Medicated and Controlled,  Endo/Other  negative endocrine ROS  Renal/GU Hx of Kidney stones  negative genitourinary   Musculoskeletal   Abdominal   Peds negative pediatric ROS (+)  Hematology negative hematology ROS (+)   Anesthesia Other Findings Marked Hx of reflux  Reproductive/Obstetrics                          Anesthesia Physical Anesthesia Plan  ASA: II  Anesthesia Plan: General   Post-op Pain Management:    Induction: Intravenous and Rapid sequence  Airway Management Planned: Oral ETT  Additional Equipment:   Intra-op Plan:   Post-operative Plan: Extubation in OR  Informed Consent: I have reviewed the patients History and Physical, chart, labs and discussed the procedure including the risks, benefits and alternatives for the proposed anesthesia with the patient or authorized representative who has indicated his/her understanding and acceptance.   Dental advisory given  Plan Discussed with: CRNA and Surgeon  Anesthesia Plan Comments:        Anesthesia Quick Evaluation

## 2015-02-22 NOTE — Progress Notes (Signed)
Awake. Talking. Left leg elevated on 2 pillows. Moves left toes without difficulty. Left toes pink in color/warm to touch. Ice pack to left leg.

## 2015-02-22 NOTE — Op Note (Signed)
02/22/2015  4:11 PM  PATIENT:  Andre Mclaughlin  39 y.o. male  PRE-OPERATIVE DIAGNOSIS:  painful hardware left tibia  POST-OPERATIVE DIAGNOSIS:  painful hardware  PROCEDURE:  Procedure(s): HARDWARE REMOVAL (Left)  SURGEON: Leitha SchullerMichael J Mairi Stagliano, MD  ASSISTANTS: None  ANESTHESIA:   general  EBL:    minimal  BLOOD ADMINISTERED:none  DRAINS: none   LOCAL MEDICATIONS USED:  NONE  SPECIMEN:  No Specimen  DISPOSITION OF SPECIMEN:  N/A  COUNTS:  YES  TOURNIQUET:  * No tourniquets in log * 39 minutes at 300 mmHg   IMPLANTS:  NONE  DICTATION: .Dragon Dictation patient brought to the operating room and after adequate general anesthesia was obtained, the left leg was prepped and draped in sterile fashion. The patient education timeout procedure completed the tourniquet was raised to 3 mmHg. Little incision of the prior scar was performed with a curvilinear incision over the proximal lateral tibia. The plate was exposed proximally and all screws removed. The 2 distal screws were removed through the small skin incisions previously utilized with C-arm aiding in finding the screws after all screws had been removed the plate was removed fracture was well-healed the wound was thoroughly irrigated. The wound was closed with 20 Vicryls deep and subcutaneous followed by skin staples. Xeroform 4 x 4's ABDs and web roll applied followed by an Ace wrap. Tourniquet was let down to close the case EBL was minimal couple cases none specimen none hardware was sent with the patient per his request and tourniquet 39 minutes mercury PLAN OF CARE: Discharge to home after PACU  PATIENT DISPOSITION:  PACU - hemodynamically stable.

## 2015-02-22 NOTE — Anesthesia Procedure Notes (Signed)
Procedure Name: Intubation Date/Time: 02/22/2015 3:07 PM Performed by: Mathews ArgyleLOGAN, Andre Mclaughlin Pre-anesthesia Checklist: Patient identified, Patient being monitored, Timeout performed, Emergency Drugs available and Suction available Patient Re-evaluated:Patient Re-evaluated prior to inductionOxygen Delivery Method: Circle system utilized Preoxygenation: Pre-oxygenation with 100% oxygen Intubation Type: IV induction and Rapid sequence Laryngoscope Size: 2 and Miller Grade View: Grade I Tube type: Oral Tube size: 7.5 mm Number of attempts: 1 Airway Equipment and Method: Stylet Placement Confirmation: ETT inserted through vocal cords under direct vision,  positive ETCO2 and breath sounds checked- equal and bilateral Secured at: 21 cm Tube secured with: Tape Dental Injury: Teeth and Oropharynx as per pre-operative assessment

## 2015-02-22 NOTE — Discharge Instructions (Signed)
AMBULATORY SURGERY  DISCHARGE INSTRUCTIONS   1) The drugs that you were given will stay in your system until tomorrow so for the next 24 hours you should not:  A) Drive an automobile B) Make any legal decisions C) Drink any alcoholic beverage   2) You may resume regular meals tomorrow.  Today it is better to start with liquids and gradually work up to solid foods.  You may eat anything you prefer, but it is better to start with liquids, then soup and crackers, and gradually work up to solid foods.   3) Please notify your doctor immediately if you have any unusual bleeding, trouble breathing, redness and pain at the surgery site, drainage, fever, or pain not relieved by medication.    4) Additional Instructions:  Ice elevation do not remove dressing.       Weight bearing as tolerated     Please contact your physician with any problems or Same Day Surgery at [phone], Monday through Friday 6 am to 4 pm, or [place] [facility] Main number at [phone].

## 2015-02-22 NOTE — Anesthesia Postprocedure Evaluation (Signed)
  Anesthesia Post-op Note  Patient: Andre Mclaughlin  Procedure(s) Performed: Procedure(s): HARDWARE REMOVAL (Left)  Anesthesia type:General  Patient location: PACU  Post pain: Pain level controlled  Post assessment: Post-op Vital signs reviewed, Patient's Cardiovascular Status Stable, Respiratory Function Stable, Patent Airway and No signs of Nausea or vomiting  Post vital signs: Reviewed and stable  Last Vitals:  Filed Vitals:   02/22/15 1633  BP: 120/88  Pulse: 84  Temp:   Resp: 15    Level of consciousness: awake, alert  and patient cooperative  Complications: No apparent anesthesia complications  

## 2015-02-23 ENCOUNTER — Encounter: Payer: Self-pay | Admitting: Orthopedic Surgery

## 2016-04-08 ENCOUNTER — Emergency Department: Payer: Medicaid Other

## 2016-04-08 ENCOUNTER — Encounter: Payer: Self-pay | Admitting: Emergency Medicine

## 2016-04-08 ENCOUNTER — Emergency Department
Admission: EM | Admit: 2016-04-08 | Discharge: 2016-04-08 | Disposition: A | Discharge status: Home / Self Care | Payer: Medicaid Other | Attending: Emergency Medicine | Admitting: Emergency Medicine

## 2016-04-08 DIAGNOSIS — N189 Chronic kidney disease, unspecified: Secondary | ICD-10-CM | POA: Insufficient documentation

## 2016-04-08 DIAGNOSIS — R51 Headache: Secondary | ICD-10-CM | POA: Diagnosis not present

## 2016-04-08 DIAGNOSIS — Y999 Unspecified external cause status: Secondary | ICD-10-CM | POA: Insufficient documentation

## 2016-04-08 DIAGNOSIS — Y9389 Activity, other specified: Secondary | ICD-10-CM | POA: Diagnosis not present

## 2016-04-08 DIAGNOSIS — S299XXA Unspecified injury of thorax, initial encounter: Secondary | ICD-10-CM | POA: Diagnosis present

## 2016-04-08 DIAGNOSIS — R519 Headache, unspecified: Secondary | ICD-10-CM

## 2016-04-08 DIAGNOSIS — S40011A Contusion of right shoulder, initial encounter: Secondary | ICD-10-CM | POA: Diagnosis not present

## 2016-04-08 DIAGNOSIS — Z791 Long term (current) use of non-steroidal anti-inflammatories (NSAID): Secondary | ICD-10-CM | POA: Diagnosis not present

## 2016-04-08 DIAGNOSIS — Y929 Unspecified place or not applicable: Secondary | ICD-10-CM | POA: Diagnosis not present

## 2016-04-08 DIAGNOSIS — S20211A Contusion of right front wall of thorax, initial encounter: Secondary | ICD-10-CM | POA: Insufficient documentation

## 2016-04-08 DIAGNOSIS — M62838 Other muscle spasm: Secondary | ICD-10-CM

## 2016-04-08 DIAGNOSIS — F17210 Nicotine dependence, cigarettes, uncomplicated: Secondary | ICD-10-CM | POA: Insufficient documentation

## 2016-04-08 MED ORDER — CYCLOBENZAPRINE HCL 10 MG PO TABS: 10.0000 mg | ORAL_TABLET | Freq: Three times a day (TID) | ORAL | 0 refills | Status: DC | PRN

## 2016-04-08 MED ORDER — KETOROLAC TROMETHAMINE 30 MG/ML IJ SOLN
30.0000 mg | Freq: Once | INTRAMUSCULAR | Status: AC
  Administered 2016-04-08: 30 mg via INTRAMUSCULAR
  Filled 2016-04-08: qty 1

## 2016-04-08 MED ORDER — NAPROXEN 500 MG PO TABS: 500.0000 mg | ORAL_TABLET | Freq: Two times a day (BID) | ORAL | 0 refills | Status: DC

## 2016-04-08 MED ORDER — DIAZEPAM 5 MG/ML IJ SOLN
2.5000 mg | Freq: Once | INTRAMUSCULAR | Status: AC
  Administered 2016-04-08: 2.5 mg via INTRAMUSCULAR
  Filled 2016-04-08: qty 2
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
## 2016-04-08 NOTE — Discharge Instructions (Signed)
Do not take Goody powders while on prescriptive medications.   May take Tylenol 4-6 hours after Naproxen as needed for further pain control.   Follow up with one of the outpatient Community Centers listed if symptoms persist. Or you may go to one of the local urgent cares for follow up.   Return to the emergency department if any worsening or new symptoms.
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
## 2016-04-08 NOTE — ED Notes (Signed)
Patient transported to X-ray 

## 2017-03-04 ENCOUNTER — Emergency Department: Payer: Medicaid Other

## 2017-03-04 ENCOUNTER — Encounter: Payer: Self-pay | Admitting: Emergency Medicine

## 2017-03-04 ENCOUNTER — Emergency Department
Admission: EM | Admit: 2017-03-04 | Discharge: 2017-03-04 | Disposition: A | Discharge status: Home / Self Care | Payer: Medicaid Other | Attending: Emergency Medicine | Admitting: Emergency Medicine

## 2017-03-04 DIAGNOSIS — R946 Abnormal results of thyroid function studies: Secondary | ICD-10-CM | POA: Insufficient documentation

## 2017-03-04 DIAGNOSIS — J0110 Acute frontal sinusitis, unspecified: Secondary | ICD-10-CM | POA: Insufficient documentation

## 2017-03-04 DIAGNOSIS — N189 Chronic kidney disease, unspecified: Secondary | ICD-10-CM | POA: Insufficient documentation

## 2017-03-04 DIAGNOSIS — F17210 Nicotine dependence, cigarettes, uncomplicated: Secondary | ICD-10-CM | POA: Diagnosis not present

## 2017-03-04 DIAGNOSIS — R1110 Vomiting, unspecified: Secondary | ICD-10-CM | POA: Diagnosis present

## 2017-03-04 DIAGNOSIS — R7989 Other specified abnormal findings of blood chemistry: Secondary | ICD-10-CM

## 2017-03-04 DIAGNOSIS — G43809 Other migraine, not intractable, without status migrainosus: Secondary | ICD-10-CM | POA: Diagnosis not present

## 2017-03-04 DIAGNOSIS — I129 Hypertensive chronic kidney disease with stage 1 through stage 4 chronic kidney disease, or unspecified chronic kidney disease: Secondary | ICD-10-CM | POA: Insufficient documentation

## 2017-03-04 LAB — COMPREHENSIVE METABOLIC PANEL
ALT: 34 U/L (ref 17–63)
ANION GAP: 10 (ref 5–15)
AST: 32 U/L (ref 15–41)
Albumin: 4.3 g/dL (ref 3.5–5.0)
Alkaline Phosphatase: 112 U/L (ref 38–126)
BUN: 20 mg/dL (ref 6–20)
CO2: 24 mmol/L (ref 22–32)
CREATININE: 1.19 mg/dL (ref 0.61–1.24)
Calcium: 9.5 mg/dL (ref 8.9–10.3)
Chloride: 106 mmol/L (ref 101–111)
GFR calc non Af Amer: >60 mL/min (ref >60)
Glucose, Bld: 167 mg/dL — ABNORMAL HIGH (ref 65–99)
POTASSIUM: 4 mmol/L (ref 3.5–5.1)
SODIUM: 140 mmol/L (ref 135–145)
Total Bilirubin: 1 mg/dL (ref 0.3–1.2)
Total Protein: 7.3 g/dL (ref 6.5–8.1)

## 2017-03-04 LAB — CBC
HCT: 49.6 % (ref 40.0–52.0)
HEMOGLOBIN: 16.9 g/dL (ref 13.0–18.0)
MCH: 29.9 pg (ref 26.0–34.0)
MCHC: 34.1 g/dL (ref 32.0–36.0)
MCV: 87.6 fL (ref 80.0–100.0)
PLATELETS: 356 10*3/uL (ref 150–440)
RBC: 5.66 MIL/uL (ref 4.40–5.90)
RDW: 13.6 % (ref 11.5–14.5)
WBC: 13.1 10*3/uL — AB (ref 3.8–10.6)

## 2017-03-04 LAB — LIPASE, BLOOD: LIPASE: 32 U/L (ref 11–51)

## 2017-03-04 LAB — TSH: TSH: 0.1 u[IU]/mL — ABNORMAL LOW (ref 0.350–4.500)

## 2017-03-04 MED ORDER — KETOROLAC TROMETHAMINE 60 MG/2ML IM SOLN
30.0000 mg | Freq: Once | INTRAMUSCULAR | Status: DC
  Filled 2017-03-04: qty 2

## 2017-03-04 MED ORDER — ONDANSETRON HCL 4 MG/2ML IJ SOLN
4.0000 mg | Freq: Once | INTRAMUSCULAR | Status: AC
  Administered 2017-03-04: 4 mg via INTRAVENOUS
  Filled 2017-03-04: qty 2

## 2017-03-04 MED ORDER — SODIUM CHLORIDE 0.9 % IV BOLUS (SEPSIS)
1000.0000 mL | Freq: Once | INTRAVENOUS | Status: AC
  Administered 2017-03-04: 1000 mL via INTRAVENOUS

## 2017-03-04 MED ORDER — AZITHROMYCIN 250 MG PO TABS: ORAL_TABLET | ORAL | 0 refills | Status: AC

## 2017-03-04 MED ORDER — IBUPROFEN 800 MG PO TABS: 800.0000 mg | ORAL_TABLET | Freq: Three times a day (TID) | ORAL | 0 refills | Status: DC | PRN

## 2017-03-04 MED ORDER — ONDANSETRON 4 MG PO TBDP: 4.0000 mg | ORAL_TABLET | Freq: Three times a day (TID) | ORAL | 0 refills | Status: AC | PRN

## 2017-03-04 MED ORDER — KETOROLAC TROMETHAMINE 30 MG/ML IJ SOLN
30.0000 mg | Freq: Once | INTRAMUSCULAR | Status: AC
  Administered 2017-03-04: 30 mg via INTRAVENOUS
  Filled 2017-03-04: qty 1

## 2017-03-04 NOTE — Discharge Instructions (Signed)
Please take the entire course of antibiotics, even if you're feeling better. You may take Motrin or Tylenol for headache or pain.  Please establish a primary care physician for reevaluation of your symptoms.  Please make an appointment with the endocrinologist for further evaluation of your abnormal thyroid testing.  Return to the emergency department if you develop severe pain, fever, or any other symptoms concerning to you.

## 2017-03-04 NOTE — ED Provider Notes (Signed)
[facility] Emergency Department Provider Note  ____________________________________________  Time seen: Approximately 12:02 PM  I have reviewed the triage vital signs and the nursing notes.   HISTORY  Chief Complaint Emesis    HPI Andre Mclaughlin is a 41 y.o. male with a history of chronic lower extremity pain on opioids, [place] GERD, migraines, presenting with multiple various symptoms per the patient reports that over the past 4-5 months, he has had 2-3 times weekly morning vomiting and occasionally becomes nauseated in the evening time after work. He also has decreased energy. Since Friday, the patient has had a headache which is typical of his migraines, described as a "needle" sensation behind the right eye and tenderness on the posterior head. There is nothing new in character or severity with this headache. The patient takes Aleve and it improves the pain but then the headache comes back. The patient denies any fever, rash, tick bites, trauma. The patient has also had 25 days of nonproductive cough.+ self-reported opioid overuse.   Past Medical History:  Diagnosis Date  . Chronic kidney disease    KIDNEY STONES  . GERD (gastroesophageal reflux disease)   . Tobacco abuse     Patient Active Problem List   Diagnosis Date Noted  . Tibial plateau fracture 02/15/2015    Past Surgical History:  Procedure Laterality Date  . APPENDECTOMY    . HARDWARE REMOVAL Left 02/22/2015   Procedure: HARDWARE REMOVAL;  Surgeon: Kennedy Bucker, MD;  Location: [facility] ORS;  Service: Orthopedics;  Laterality: Left;  . ORIF TIBIA PLATEAU Left     Current Outpatient Rx  . Order #: [id number] Class: Print  . Order #: [id number] Class: Print  . Order #: [id number] Class: Historical Med  . Order #: [id number] Class: Print  . Order #: [id number] Class: Print  . Order #: [id number] Class: Print  . Order #: [id number] Class: Historical Med  . Order #: [id number] Class: Print  . Order #:  [id number] Class: Print    Allergies Patient has no known allergies.  History reviewed. No pertinent family history.  Social History Social History  Substance Use Topics  . Smoking status: Current Every Day Smoker    Packs/day: 1.50    Years: 20.00    Types: Cigarettes  . Smokeless tobacco: Never Used  . Alcohol use 0.0 oz/week     Comment: occasional    Review of Systems Constitutional: No fever/chills.No lightheadedness or syncope. No trauma. Eyes: No visual changes. No blurred or double vision. ENT: No sore throat. + congestion and rhinorrhea. Cardiovascular: Denies chest pain. Denies palpitations. Respiratory: Denies shortness of breath.  + cough. Gastrointestinal: No abdominal pain.  No nausea, no vomiting.  No diarrhea.  No constipation. Genitourinary: Negative for dysuria. Musculoskeletal: Negative for back pain. Skin: Negative for rash. Neurological: + for headaches. No focal numbness, tingling or weakness. No changes in vision, no changes in speech. No difficulty walking. Psychiatric:Positive opioid abuse. Positive ongoing tobacco abuse.    ____________________________________________   PHYSICAL EXAM:  VITAL SIGNS: ED Triage Vitals [03/04/17 1013]  Enc Vitals Group     BP 131/87     Pulse Rate (!) 114     Resp 20     Temp 98.7 F (37.1 C)     Temp Source Oral     SpO2 98 %     Weight 184 lb (83.5 kg)     Height 6' (1.829 m)     Head Circumference      Peak Flow      Pain  Score 6     Pain Loc      Pain Edu?      Excl. in GC?     Constitutional: Alert and oriented. Well appearing and in no acute distress. Answers questions appropriately. Eyes: Conjunctivae are normal.  EOMI. PERRLA. No scleral icterus. Head: Atraumatic. No raccoon eyes or Battle sign. Nose: No congestion/rhinnorhea. Mouth/Throat: Mucous membranes are moist.  Neck: No stridor.  Supple.  No JVD. No meningismus. Cardiovascular: Normal rate, regular rhythm. No murmurs, rubs or  gallops.  Respiratory: Normal respiratory effort.  No accessory muscle use or retractions. Lungs CTAB.  No wheezes, rales or ronchi. Gastrointestinal: Soft, nontender and nondistended.  No guarding or rebound.  No peritoneal signs. Musculoskeletal: No LE edema. No ttp in the calves or palpable cords.  Negative Homan's sign. Neurologic: Alert and oriented 3. Speech is clear. Face and smile symmetric. Tongue is midline. EOMI. PERRLA. No horizontal or vertical nystagmus.  No pronator drift. 5 out of 5 grip, biceps, triceps, hip flexors, plantar flexion and dorsiflexion. Normal sensation to light touch in the bilateral upper and lower extremities, and face. Normal gait without ataxia. Skin:  Skin is warm, dry and intact. No rash noted. Psychiatric: The patient has a depressed mood and a flat affect with occasional agitation. However, he does have good insight to why he is here  ____________________________________________   LABS (all labs ordered are listed, but only abnormal results are displayed)  Labs Reviewed  COMPREHENSIVE METABOLIC PANEL - Abnormal; Notable for the following:       Result Value   Glucose, Bld 167 (*)    All other components within normal limits  CBC - Abnormal; Notable for the following:    WBC 13.1 (*)    All other components within normal limits  TSH - Abnormal; Notable for the following:    TSH 0.100 (*)    All other components within normal limits  LIPASE, BLOOD  URINALYSIS, COMPLETE (UACMP) WITH MICROSCOPIC   ____________________________________________  EKG  ED ECG REPORT I, Rockne Menghini, the attending physician, personally viewed and interpreted this ECG.   Date: 03/04/2017  EKG Time: 1230  Rate: 59  Rhythm: sinus bradycardia  Axis: normal  Intervals:none  ST&T Change: Nonspecific T-wave inversion in V1. No STEMI.  ____________________________________________  RADIOLOGY  Dg Chest 2 View  Result Date: 03/04/2017 CLINICAL DATA:  Cough  and congestion . EXAM: CHEST  2 VIEW COMPARISON:  04/08/2016. FINDINGS: Mediastinum hilar structures normal. Lungs are clear. No pleural effusion or pneumothorax. Degenerative changes thoracic spine. No acute bony abnormality . IMPRESSION: No acute cardiopulmonary disease. Electronically Signed   By: Maisie Fus  Register   On: 03/04/2017 10:48   Ct Head Wo Contrast  Result Date: 03/04/2017 CLINICAL DATA:  Headache and vomiting. EXAM: CT HEAD WITHOUT CONTRAST TECHNIQUE: Contiguous axial images were obtained from the base of the skull through the vertex without intravenous contrast. COMPARISON:  None. FINDINGS: Brain: No evidence of acute infarction, hemorrhage, hydrocephalus, extra-axial collection or mass lesion/mass effect. Vascular: No hyperdense vessel or unexpected calcification. Skull: Normal. Negative for fracture or focal lesion. Sinuses/Orbits: Partial opacification of the right frontal sinus, right frontoethmoidal recess, and bilateral anterior ethmoid air cells. The remaining paranasal sinuses and mastoid air cells are clear. Other: None. IMPRESSION: 1.  No acute intracranial abnormality. 2. Partial opacification of the right frontal sinus, right frontoethmoidal recess, and bilateral anterior ethmoid air cells. Correlate for signs and symptoms of acute sinusitis. Electronically Signed   By: Obie Dredge  M.D.   On: 03/04/2017 12:20    ____________________________________________   PROCEDURES  Procedure(s) performed: None  Procedures  Critical Care performed: No ____________________________________________   INITIAL IMPRESSION / ASSESSMENT AND PLAN / ED COURSE  Pertinent labs & imaging results that were available during my care of the patient were reviewed by me and considered in my medical decision making (see chart for details).  41 y.o. male with multiple chronic complaints today, acutely with typical migraine. Overall, the patient has no focal neurologic deficits on my  examination. We'll get a CT of the head given his morning vomiting, to rule out intracranial mass. Subarachnoid hemorrhage or intracranial process such as CVA is much less likely. The patient's laboratory studies are reassuring, and his chest x-ray does not show any acute process. Plan reevaluation for final disposition.  ----------------------------------------- 12:53 PM on 03/04/2017 -----------------------------------------  The patient's workup in the emergency department has been reassuring. His CT scan does not show any intracranial pathology. He does have evidence of sinusitis, so treated him with a Z-Pak. Return questions as well as follow-up instructions were discussed.  The patient does have an elevated TSH, and I will refer him to endocrinology for further evaluation. He has no evidence of thyroid storm today.  ____________________________________________  FINAL CLINICAL IMPRESSION(S) / ED DIAGNOSES  Final diagnoses:  Acute frontal sinusitis, recurrence not specified  Other migraine without status migrainosus, not intractable  Elevated TSH          NEW MEDICATIONS STARTED DURING THIS VISIT:  New Prescriptions   AZITHROMYCIN (ZITHROMAX Z-PAK) 250 MG TABLET    Take 2 tablets (500 mg) on  Day 1,  followed by 1 tablet (250 mg) once daily on Days 2 through 5.   IBUPROFEN (ADVIL,MOTRIN) 800 MG TABLET    Take 1 tablet (800 mg total) by mouth every 8 (eight) hours as needed (with food).   ONDANSETRON (ZOFRAN ODT) 4 MG DISINTEGRATING TABLET    Take 1 tablet (4 mg total) by mouth every 8 (eight) hours as needed for nausea or vomiting.      Rockne MenghiniNorman, Anne-Caroline, MD 03/04/17 1302

## 2017-03-04 NOTE — ED Triage Notes (Signed)
Pt c/o migraine for last 3-4 days.  Also c/o knot that came up on right forehead.  Wants to be seen for congestion and cough that has been present for 25 days.  Has had vomiting that comes and goes over last 6 months.  Has seen blood one time over last 6 months. Has also had diarrhea at times per pt.  Decreased appetite as well.  C/o insomnia for last 3 years.  Started taking melatonin and was taking 40 mg to go to sleep.  Quit taking this.

## 2017-04-09 ENCOUNTER — Encounter: Payer: Self-pay | Admitting: Family Medicine

## 2017-04-09 ENCOUNTER — Ambulatory Visit (INDEPENDENT_AMBULATORY_CARE_PROVIDER_SITE_OTHER): Payer: Medicaid Other | Admitting: Family Medicine

## 2017-04-09 VITALS — BP 130/76 | HR 74 | Temp 98.4°F | Resp 16 | Ht 72.0 in | Wt 176.0 lb

## 2017-04-09 DIAGNOSIS — E0590 Thyrotoxicosis, unspecified without thyrotoxic crisis or storm: Secondary | ICD-10-CM | POA: Diagnosis not present

## 2017-04-09 DIAGNOSIS — R634 Abnormal weight loss: Secondary | ICD-10-CM

## 2017-04-09 DIAGNOSIS — G8929 Other chronic pain: Secondary | ICD-10-CM | POA: Diagnosis not present

## 2017-04-09 DIAGNOSIS — R5383 Other fatigue: Secondary | ICD-10-CM | POA: Diagnosis not present

## 2017-04-09 DIAGNOSIS — R5381 Other malaise: Secondary | ICD-10-CM | POA: Diagnosis not present

## 2017-04-09 DIAGNOSIS — M25562 Pain in left knee: Secondary | ICD-10-CM | POA: Diagnosis not present

## 2017-04-09 NOTE — Progress Notes (Signed)
Name: Andre Mclaughlin   MRN: [id number]    DOB: [birth year]   Date:04/09/2017       Progress Note  Subjective  Chief Complaint  Chief Complaint  Patient presents with  . Establish Care  . Knee Pain  . Fatigue    Nausea, Insomnia    Knee Pain   The incident occurred more than 1 week ago. The injury mechanism was a direct blow (s/p hardware/ removal). The pain is present in the left knee. The quality of the pain is described as aching. The pain is at a severity of 6/10. The pain is mild. The pain has been fluctuating since onset. Pertinent negatives include no inability to bear weight, loss of motion, loss of sensation, muscle weakness, numbness or tingling. The symptoms are aggravated by movement and weight bearing. He has tried NSAIDs and acetaminophen for the symptoms. The treatment provided moderate relief.  Thyroid Problem  Presents for follow-up visit. Symptoms include anxiety, hoarse voice and weight loss. Patient reports no cold intolerance, constipation, diaphoresis, diarrhea, dry skin, fatigue, hair loss, heat intolerance, leg swelling, palpitations, visual change or weight gain. The symptoms have been worsening.    No problem-specific Assessment & Plan notes found for this encounter.   Past Medical History:  Diagnosis Date  . Chronic kidney disease    KIDNEY STONES  . GERD (gastroesophageal reflux disease)   . Tobacco abuse     Past Surgical History:  Procedure Laterality Date  . APPENDECTOMY    . HARDWARE REMOVAL Left 02/22/2015   Procedure: HARDWARE REMOVAL;  Surgeon: Kennedy Bucker, MD;  Location: [facility] ORS;  Service: Orthopedics;  Laterality: Left;  . ORIF TIBIA PLATEAU Left     No family history on file.  Social History   Social History  . Marital status: Single    Spouse name: N/A  . Number of children: 2  . Years of education: 10th grade   Occupational History  . Not on file.   Social History Main Topics  . Smoking status: Current Every Day Smoker     Packs/day: 1.50    Years: 20.00    Types: Cigarettes  . Smokeless tobacco: Never Used  . Alcohol use No     Comment: occasional  . Drug use: No  . Sexual activity: Not Currently   Other Topics Concern  . Not on file   Social History Narrative  . No narrative on file    No Known Allergies  Outpatient Medications Prior to Visit  Medication Sig Dispense Refill  . famotidine (PEPCID) 10 MG tablet Take 10 mg by mouth as needed for heartburn or indigestion.    . naproxen sodium (RA NAPROXEN SODIUM) 220 MG tablet Take 2 tablets by mouth every 6 (six) hours as needed.    . ondansetron (ZOFRAN ODT) 4 MG disintegrating tablet Take 1 tablet (4 mg total) by mouth every 8 (eight) hours as needed for nausea or vomiting. 12 tablet 0  . cyclobenzaprine (FLEXERIL) 10 MG tablet Take 1 tablet (10 mg total) by mouth 3 (three) times daily as needed for muscle spasms. 21 tablet 0  . HYDROcodone-acetaminophen (NORCO) 5-325 MG per tablet Take 1 tablet by mouth every 6 (six) hours as needed for moderate pain. 30 tablet 0  . ibuprofen (ADVIL,MOTRIN) 800 MG tablet Take 1 tablet (800 mg total) by mouth every 8 (eight) hours as needed (with food). 15 tablet 0  . naproxen (NAPROSYN) 500 MG tablet Take 1 tablet (500 mg total) by mouth  2 (two) times daily with a meal. 14 tablet 0  . traMADol (ULTRAM) 50 MG tablet Take 1 tablet (50 mg total) by mouth every 6 (six) hours as needed for moderate pain. 30 tablet 0   No facility-administered medications prior to visit.     Review of Systems  Constitutional: Positive for malaise/fatigue and weight loss. Negative for chills, diaphoresis, fatigue, fever and weight gain.       Malaise 8-12 weeks  HENT: Positive for hoarse voice. Negative for ear discharge, ear pain and sore throat.   Eyes: Negative for blurred vision.  Respiratory: Negative for cough, sputum production, shortness of breath and wheezing.   Cardiovascular: Negative for chest pain, palpitations, orthopnea  and leg swelling.  Gastrointestinal: Positive for heartburn and nausea. Negative for abdominal pain, blood in stool, constipation, diarrhea, melena and vomiting.  Genitourinary: Negative for dysuria, frequency, hematuria and urgency.  Musculoskeletal: Positive for joint pain. Negative for back pain, myalgias and neck pain.  Skin: Negative for rash.  Neurological: Negative for dizziness, tingling, sensory change, focal weakness, weakness, numbness and headaches.  Endo/Heme/Allergies: Negative for environmental allergies, cold intolerance, heat intolerance and polydipsia. Does not bruise/bleed easily.  Psychiatric/Behavioral: Negative for depression and suicidal ideas. The patient is nervous/anxious and has insomnia.      Objective  Vitals:   04/09/17 1428  BP: 130/76  Pulse: 74  Resp: 16  Temp: 98.4 F (36.9 C)  TempSrc: Oral  SpO2: 98%  Weight: 176 lb (79.8 kg)  Height: 6' (1.829 m)    Physical Exam  Constitutional: He is oriented to person, place, and time and well-developed, well-nourished, and in no distress.  HENT:  Head: Normocephalic.  Right Ear: External ear normal.  Left Ear: External ear normal.  Nose: Nose normal.  Mouth/Throat: Oropharynx is clear and moist.  Eyes: Pupils are equal, round, and reactive to light. Conjunctivae and EOM are normal. Right eye exhibits no discharge. Left eye exhibits no discharge. No scleral icterus.  Neck: Normal range of motion. Neck supple. No JVD present. No tracheal deviation present. No thyromegaly present.  Cardiovascular: Normal rate, regular rhythm, normal heart sounds and intact distal pulses.  Exam reveals no gallop and no friction rub.   No murmur heard. Pulmonary/Chest: Breath sounds normal. No respiratory distress. He has no wheezes. He has no rales.  Abdominal: Soft. Bowel sounds are normal. He exhibits no mass. There is no hepatosplenomegaly. There is no tenderness. There is no rebound, no guarding and no CVA tenderness.   Musculoskeletal: Normal range of motion. He exhibits no edema or tenderness.  Lymphadenopathy:    He has no cervical adenopathy.  Neurological: He is alert and oriented to person, place, and time. He has normal sensation, normal strength, normal reflexes and intact cranial nerves. No cranial nerve deficit.  Skin: Skin is warm. No rash noted.  Psychiatric: Mood and affect normal.  Nursing note and vitals reviewed.     Assessment & Plan  Problem List Items Addressed This Visit    None    Visit Diagnoses    Malaise and fatigue    -  Primary   Relevant Orders   Thyroid Panel With TSH   CBC with Differential/Platelet   Weight loss       Relevant Orders   Thyroid Panel With TSH   CBC with Differential/Platelet   Chronic pain of left knee       hx plateau fracture/ repair /    Relevant Orders   Ambulatory referral to  Orthopedic Surgery   Hyperthyroidism       Relevant Orders   Thyroid Panel With TSH      No orders of the defined types were placed in this encounter.     Dr. Hayden [facility]  04/09/17

## 2017-04-19 LAB — CBC WITH DIFFERENTIAL/PLATELET
BASOS: 0 %
Basophils Absolute: 0 10*3/uL (ref 0.0–0.2)
EOS (ABSOLUTE): 0.5 10*3/uL — AB (ref 0.0–0.4)
EOS: 5 %
HEMATOCRIT: 51.1 % — AB (ref 37.5–51.0)
HEMOGLOBIN: 17.2 g/dL (ref 13.0–17.7)
IMMATURE GRANS (ABS): 0 10*3/uL (ref 0.0–0.1)
Immature Granulocytes: 0 %
Lymphocytes Absolute: 2.8 10*3/uL (ref 0.7–3.1)
Lymphs: 26 %
MCH: 29.9 pg (ref 26.6–33.0)
MCHC: 33.7 g/dL (ref 31.5–35.7)
MCV: 89 fL (ref 79–97)
MONOCYTES: 9 %
Monocytes Absolute: 1 10*3/uL — ABNORMAL HIGH (ref 0.1–0.9)
NEUTROS ABS: 6.6 10*3/uL (ref 1.4–7.0)
NEUTROS PCT: 60 %
Platelets: 361 10*3/uL (ref 150–379)
RBC: 5.76 x10E6/uL (ref 4.14–5.80)
RDW: 13.9 % (ref 12.3–15.4)
WBC: 11 10*3/uL — ABNORMAL HIGH (ref 3.4–10.8)

## 2017-04-19 LAB — THYROID PANEL WITH TSH
Free Thyroxine Index: 2.5 (ref 1.2–4.9)
T3 UPTAKE RATIO: 28 % (ref 24–39)
T4, Total: 8.9 ug/dL (ref 4.5–12.0)
TSH: 0.928 u[IU]/mL (ref 0.450–4.500)

## 2017-04-29 ENCOUNTER — Encounter: Payer: Self-pay | Admitting: *Deleted

## 2017-04-29 ENCOUNTER — Emergency Department
Admission: EM | Admit: 2017-04-29 | Discharge: 2017-04-29 | Disposition: A | Discharge status: Home / Self Care | Payer: Medicaid Other | Attending: Emergency Medicine | Admitting: Emergency Medicine

## 2017-04-29 DIAGNOSIS — R600 Localized edema: Secondary | ICD-10-CM | POA: Diagnosis present

## 2017-04-29 DIAGNOSIS — L02411 Cutaneous abscess of right axilla: Secondary | ICD-10-CM | POA: Diagnosis not present

## 2017-04-29 DIAGNOSIS — L02412 Cutaneous abscess of left axilla: Secondary | ICD-10-CM | POA: Insufficient documentation

## 2017-04-29 DIAGNOSIS — F17210 Nicotine dependence, cigarettes, uncomplicated: Secondary | ICD-10-CM | POA: Insufficient documentation

## 2017-04-29 DIAGNOSIS — L0291 Cutaneous abscess, unspecified: Secondary | ICD-10-CM

## 2017-04-29 DIAGNOSIS — N189 Chronic kidney disease, unspecified: Secondary | ICD-10-CM | POA: Insufficient documentation

## 2017-04-29 MED ORDER — LIDOCAINE HCL 1 % IJ SOLN
5.0000 mL | Freq: Once | INTRAMUSCULAR | Status: DC
  Filled 2017-04-29: qty 5

## 2017-04-29 MED ORDER — HYDROCODONE-ACETAMINOPHEN 5-325 MG PO TABS
1.0000 | ORAL_TABLET | Freq: Once | ORAL | Status: AC
  Administered 2017-04-29: 1 via ORAL
  Filled 2017-04-29: qty 1

## 2017-04-29 MED ORDER — HYDROCODONE-ACETAMINOPHEN 5-325 MG PO TABS: 1.0000 | ORAL_TABLET | Freq: Four times a day (QID) | ORAL | 0 refills | Status: AC | PRN

## 2017-04-29 MED ORDER — LIDOCAINE HCL (PF) 1 % IJ SOLN
INTRAMUSCULAR | Status: AC
  Filled 2017-04-29: qty 5

## 2017-04-29 MED ORDER — CLINDAMYCIN HCL 300 MG PO CAPS: 300.0000 mg | ORAL_CAPSULE | Freq: Three times a day (TID) | ORAL | 0 refills | Status: AC

## 2017-04-29 NOTE — ED Triage Notes (Signed)
Pt states abscess in both his armpits

## 2017-04-29 NOTE — ED Notes (Signed)
Pt reports that he has pain in bilat arm pits - he has abscesses noted - last week he had a bump under each arm pit and he thought it was from changing fabric softeners but Wed he started with severe pain and multiple areas - areas to bilat arm pits are red/swollen and tender to touch upon assessment

## 2017-04-29 NOTE — ED Provider Notes (Signed)
[facility] Emergency Department Provider Note  ____________________________________________  Time seen: Approximately 6:55 PM  I have reviewed the triage vital signs and the nursing notes.   HISTORY  Chief Complaint Abscess    HPI ORDEAN Mclaughlin is a 41 y.o. male presenting to the emergency department with axillary abscesses bilaterally. Patient states that abscesses occurred after using Downy laundry soap. Patient has had low-grade fever. Patient states that his sleep has been disturbed. He has tried warm compresses. No other alleviating measures have been attempted.   Past Medical History:  Diagnosis Date  . Chronic kidney disease    KIDNEY STONES  . GERD (gastroesophageal reflux disease)   . Tobacco abuse     Patient Active Problem List   Diagnosis Date Noted  . Tibial plateau fracture 02/15/2015    Past Surgical History:  Procedure Laterality Date  . APPENDECTOMY    . HARDWARE REMOVAL Left 02/22/2015   Procedure: HARDWARE REMOVAL;  Surgeon: Kennedy Bucker, MD;  Location: [facility] ORS;  Service: Orthopedics;  Laterality: Left;  . ORIF TIBIA PLATEAU Left     Prior to Admission medications   Medication Sig Start Date End Date Taking? Authorizing Provider  clindamycin (CLEOCIN) 300 MG capsule Take 1 capsule (300 mg total) by mouth 3 (three) times daily. 04/29/17 05/09/17  Orvil Feil, PA-C  famotidine (PEPCID) 10 MG tablet Take 10 mg by mouth as needed for heartburn or indigestion.    [provider]  HYDROcodone-acetaminophen (NORCO/VICODIN) 5-325 MG tablet Take 1 tablet by mouth every 6 (six) hours as needed for moderate pain. 04/29/17 05/02/17  Orvil Feil, PA-C  naproxen sodium (RA NAPROXEN SODIUM) 220 MG tablet Take 2 tablets by mouth every 6 (six) hours as needed.    [provider]  ondansetron (ZOFRAN ODT) 4 MG disintegrating tablet Take 1 tablet (4 mg total) by mouth every 8 (eight) hours as needed for nausea or  vomiting. 03/04/17   Rockne Menghini, MD    Allergies Patient has no known allergies.  History reviewed. No pertinent family history.  Social History Social History  Substance Use Topics  . Smoking status: Current Every Day Smoker    Packs/day: 1.50    Years: 20.00    Types: Cigarettes  . Smokeless tobacco: Never Used  . Alcohol use No     Comment: occasional     Review of Systems  Constitutional: No fever/chills Eyes: No visual changes. No discharge ENT: No upper respiratory complaints. Cardiovascular: no chest pain. Respiratory: no cough. No SOB. Skin: Patient has axillary abscess formation bilaterally.  Neurological: Negative for headaches, focal weakness or numbness.   ____________________________________________   PHYSICAL EXAM:  VITAL SIGNS: ED Triage Vitals  Enc Vitals Group     BP 04/29/17 1704 130/85     Pulse Rate 04/29/17 1704 (!) 102     Resp 04/29/17 1704 18     Temp 04/29/17 1704 99.7 F (37.6 C)     Temp Source 04/29/17 1704 Oral     SpO2 04/29/17 1704 97 %     Weight 04/29/17 1704 175 lb (79.4 kg)     Height 04/29/17 1704 6' (1.829 m)     Head Circumference --      Peak Flow --      Pain Score 04/29/17 1703 10     Pain Loc --      Pain Edu? --      Excl. in GC? --      Constitutional: Alert  and oriented. Well appearing and in no acute distress. Eyes: Conjunctivae are normal. PERRL. EOMI. Head: Atraumatic. Cardiovascular: Normal rate, regular rhythm. Normal S1 and S2.  Good peripheral circulation. Respiratory: Normal respiratory effort without tachypnea or retractions. Lungs CTAB. Good air entry to the bases with no decreased or absent breath sounds. Gastrointestinal: Bowel sounds 4 quadrants. Soft and nontender to palpation. No guarding or rigidity. No palpable masses. No distention. No CVA tenderness. Musculoskeletal: Full range of motion to all extremities. No gross deformities appreciated. Neurologic:  Normal speech and  language. No gross focal neurologic deficits are appreciated.  Skin: Patient has 3 axillary abscesses on the right and 1 axillary abscess on the left. Psychiatric: Mood and affect are normal. Speech and behavior are normal. Patient exhibits appropriate insight and judgement.   ____________________________________________   LABS (all labs ordered are listed, but only abnormal results are displayed)  Labs Reviewed - No data to display ____________________________________________  EKG   ____________________________________________  RADIOLOGY   No results found.  ____________________________________________    PROCEDURES  Procedure(s) performed:    Procedures  INCISION AND DRAINAGE Performed by: Orvil Feil Consent: Verbal consent obtained. Risks and benefits: risks, benefits and alternatives were discussed Type: abscess  Body area:  Right axilla: 3  Anesthesia: local infiltration  Incision was made with a scalpel.  Local anesthetic: lidocaine 1% without epinephrine  Anesthetic total: 5 ml  Complexity: complex Blunt dissection to break up loculations  Drainage: purulent  Drainage amount: Copious  Patient tolerance: Patient tolerated the procedure well with no immediate complications.     Medications  HYDROcodone-acetaminophen (NORCO/VICODIN) 5-325 MG per tablet 1 tablet (1 tablet Oral Given 04/29/17 1910)     ____________________________________________   INITIAL IMPRESSION / ASSESSMENT AND PLAN / ED COURSE  Pertinent labs & imaging results that were available during my care of the patient were reviewed by me and considered in my medical decision making (see chart for details).  Review of the [place] CSRS was performed in accordance of the [facility] prior to dispensing any controlled drugs.     Assessment and plan Cutaneous abscesses Patient presents to the emergency department with bilateral axillary abscesses. Patient underwent incision and  drainage without complication. Patient was discharged with clindamycin. Patient was advised to follow-up with primary care as needed. Patient voiced understanding regarding this recommendation. All patient questions were answered.   ____________________________________________  FINAL CLINICAL IMPRESSION(S) / ED DIAGNOSES  Final diagnoses:  Abscess      NEW MEDICATIONS STARTED DURING THIS VISIT:  Discharge Medication List as of 04/29/2017  8:39 PM    START taking these medications   Details  clindamycin (CLEOCIN) 300 MG capsule Take 1 capsule (300 mg total) by mouth 3 (three) times daily., Starting Mon 04/29/2017, Until Thu 05/09/2017, Print    HYDROcodone-acetaminophen (NORCO/VICODIN) 5-325 MG tablet Take 1 tablet by mouth every 6 (six) hours as needed for moderate pain., Starting Mon 04/29/2017, Until Thu 05/02/2017, Print            This chart was dictated using voice recognition software/Dragon. Despite best efforts to proofread, errors can occur which can change the meaning. Any change was purely unintentional.    Orvil Feil, PA-C 04/30/17 Serita Kyle, [place], MD 05/01/17 2218

## 2017-05-28 DIAGNOSIS — R630 Anorexia: Secondary | ICD-10-CM | POA: Diagnosis not present

## 2017-05-28 DIAGNOSIS — R634 Abnormal weight loss: Secondary | ICD-10-CM | POA: Diagnosis not present

## 2017-05-28 DIAGNOSIS — R110 Nausea: Secondary | ICD-10-CM | POA: Diagnosis not present

## 2017-05-28 DIAGNOSIS — R6881 Early satiety: Secondary | ICD-10-CM | POA: Diagnosis not present

## 2017-05-28 DIAGNOSIS — Z8379 Family history of other diseases of the digestive system: Secondary | ICD-10-CM | POA: Diagnosis not present

## 2018-04-01 ENCOUNTER — Other Ambulatory Visit: Payer: Self-pay

## 2018-04-01 ENCOUNTER — Emergency Department
Admission: EM | Admit: 2018-04-01 | Discharge: 2018-04-01 | Disposition: A | Discharge status: Home / Self Care | Payer: Medicaid Other | Attending: Emergency Medicine | Admitting: Emergency Medicine

## 2018-04-01 ENCOUNTER — Emergency Department: Payer: Medicaid Other

## 2018-04-01 ENCOUNTER — Encounter: Payer: Self-pay | Admitting: Emergency Medicine

## 2018-04-01 DIAGNOSIS — M25462 Effusion, left knee: Secondary | ICD-10-CM | POA: Insufficient documentation

## 2018-04-01 DIAGNOSIS — M25562 Pain in left knee: Secondary | ICD-10-CM | POA: Diagnosis not present

## 2018-04-01 DIAGNOSIS — F17210 Nicotine dependence, cigarettes, uncomplicated: Secondary | ICD-10-CM | POA: Insufficient documentation

## 2018-04-01 DIAGNOSIS — Z79899 Other long term (current) drug therapy: Secondary | ICD-10-CM | POA: Diagnosis not present

## 2018-04-01 DIAGNOSIS — M7989 Other specified soft tissue disorders: Secondary | ICD-10-CM | POA: Diagnosis not present

## 2018-04-01 MED ORDER — KETOROLAC TROMETHAMINE 30 MG/ML IJ SOLN
30.0000 mg | Freq: Once | INTRAMUSCULAR | Status: AC
  Administered 2018-04-01: 30 mg via INTRAMUSCULAR
  Filled 2018-04-01: qty 1

## 2018-04-01 MED ORDER — OXYCODONE-ACETAMINOPHEN 5-325 MG PO TABS
1.0000 | ORAL_TABLET | Freq: Once | ORAL | Status: AC
  Administered 2018-04-01: 1 via ORAL
  Filled 2018-04-01: qty 1

## 2018-04-01 MED ORDER — OXYCODONE-ACETAMINOPHEN 5-325 MG PO TABS: 1.0000 | ORAL_TABLET | ORAL | 0 refills | Status: AC | PRN

## 2018-04-01 MED ORDER — NAPROXEN 500 MG PO TABS: 500.0000 mg | ORAL_TABLET | Freq: Two times a day (BID) | ORAL | 0 refills | Status: AC

## 2018-04-01 NOTE — ED Triage Notes (Signed)
Patient states he has had surgery on left knee in the past, and he heard a "pop" when he squatted down this AM.

## 2018-04-01 NOTE — ED Provider Notes (Signed)
[facility] Emergency Department Provider Note  ____________________________________________  Time seen: Approximately 11:22 AM  I have reviewed the triage vital signs and the nursing notes.   HISTORY  Chief Complaint Knee Pain    HPI Andre Mclaughlin is a 42 y.o. male that presents emergency department for evaluation of left knee pain since this morning.  Patient bent over this morning and heard a pop.  Pain is in the back of his knee.  Pain is worse when he tries to bend his knee.  He has had several knee surgeries in the past.  His doctor is Dr. Rosita KeaMenz.  No IV drug use.  No wounds or specific injury.  No history of gout.  Past Medical History:  Diagnosis Date  . Chronic kidney disease    KIDNEY STONES  . GERD (gastroesophageal reflux disease)   . Tobacco abuse     Patient Active Problem List   Diagnosis Date Noted  . Tibial plateau fracture 02/15/2015    Past Surgical History:  Procedure Laterality Date  . APPENDECTOMY    . HARDWARE REMOVAL Left 02/22/2015   Procedure: HARDWARE REMOVAL;  Surgeon: Kennedy BuckerMichael Menz, MD;  Location: [facility] ORS;  Service: Orthopedics;  Laterality: Left;  . ORIF TIBIA PLATEAU Left     Prior to Admission medications   Medication Sig Start Date End Date Taking? Authorizing Provider  famotidine (PEPCID) 10 MG tablet Take 10 mg by mouth as needed for heartburn or indigestion.    [provider]  naproxen (NAPROSYN) 500 MG tablet Take 1 tablet (500 mg total) by mouth 2 (two) times daily with a meal. 04/01/18 04/01/19  Enid DerryWagner, Destany Severns, PA-C  naproxen sodium (RA NAPROXEN SODIUM) 220 MG tablet Take 2 tablets by mouth every 6 (six) hours as needed.    [provider]  ondansetron (ZOFRAN ODT) 4 MG disintegrating tablet Take 1 tablet (4 mg total) by mouth every 8 (eight) hours as needed for nausea or vomiting. 03/04/17   Rockne MenghiniNorman, Anne-Caroline, MD  oxyCODONE-acetaminophen (PERCOCET) 5-325 MG tablet Take 1 tablet by mouth  every 4 (four) hours as needed for severe pain. 04/01/18 04/01/19  Enid DerryWagner, Jakelin Taussig, PA-C    Allergies Patient has no known allergies.  No family history on file.  Social History Social History   Tobacco Use  . Smoking status: Current Every Day Smoker    Packs/day: 1.50    Years: 20.00    Pack years: 30.00    Types: Cigarettes  . Smokeless tobacco: Never Used  Substance Use Topics  . Alcohol use: No    Alcohol/week: 0.0 standard drinks    Comment: occasional  . Drug use: No     Review of Systems  Cardiovascular: No chest pain. Respiratory: No SOB. Gastrointestinal: No abdominal pain.  No nausea, no vomiting.  Musculoskeletal: Positive for knee pain. Skin: Negative for rash, abrasions, lacerations, ecchymosis. Neurological: Negative for headaches, numbness or tingling   ____________________________________________   PHYSICAL EXAM:  VITAL SIGNS: ED Triage Vitals  Enc Vitals Group     BP 04/01/18 1036 125/81     Pulse Rate 04/01/18 1036 81     Resp 04/01/18 1036 16     Temp 04/01/18 1036 99.1 F (37.3 C)     Temp Source 04/01/18 1036 Oral     SpO2 04/01/18 1036 99 %     Weight 04/01/18 1037 168 lb (76.2 kg)     Height 04/01/18 1037 6' (1.829 m)     Head Circumference --  Peak Flow --      Pain Score 04/01/18 1037 8     Pain Loc --      Pain Edu? --      Excl. in GC? --      Constitutional: Alert and oriented. Well appearing and in no acute distress. Eyes: Conjunctivae are normal. PERRL. EOMI. Head: Atraumatic. ENT:      Ears:      Nose: No congestion/rhinnorhea.      Mouth/Throat: Mucous membranes are moist.  Neck: No stridor.  Cardiovascular: Normal rate, regular rhythm.  Good peripheral circulation. Respiratory: Normal respiratory effort without tachypnea or retractions. Lungs CTAB. Good air entry to the bases with no decreased or absent breath sounds. Musculoskeletal: Full range of motion to all extremities. No gross deformities appreciated.   Tenderness to palpation of anterior and posterior knee.  No erythema or swelling.  No calf tenderness.  Full range of motion of knee.  Pain elicited with complete flexion of knee. Neurologic:  Normal speech and language. No gross focal neurologic deficits are appreciated.  Skin:  Skin is warm, dry and intact. No rash noted. Psychiatric: Mood and affect are normal. Speech and behavior are normal. Patient exhibits appropriate insight and judgement.   ____________________________________________   LABS (all labs ordered are listed, but only abnormal results are displayed)  Labs Reviewed - No data to display ____________________________________________  EKG   ____________________________________________  RADIOLOGY Lexine BatonI, Salome Hautala, personally viewed and evaluated these images (plain radiographs) as part of my medical decision making, as well as reviewing the written report by the radiologist.  Ct Knee Left Wo Contrast  Result Date: 04/01/2018 CLINICAL DATA:  Left knee pain and swelling after feeling a pop this morning. History of prior tibial plateau fracture. EXAM: CT OF THE LEFT KNEE WITHOUT CONTRAST TECHNIQUE: Multidetector CT imaging of the left knee was performed according to the standard protocol. Multiplanar CT image reconstructions were also generated. COMPARISON:  Left knee x-rays from same day. Intraoperative x-rays dated February 22, 2015 and May 04, 2014. FINDINGS: Bones/Joint/Cartilage No acute fracture or dislocation. Mild lateral subluxation of the patella. Moderate joint effusion. Chronic mildly depressed lateral tibial plateau fracture with ghost tracks related to prior hardware. Joint spaces are preserved. Tiny marginal tricompartmental osteophytes. Ligaments Suboptimally assessed by CT. Muscles and Tendons The extensor mechanism is intact.  No muscle atrophy. Soft tissues No soft tissue mass or fluid collection.  No hematoma. IMPRESSION: 1.  No acute osseous abnormality.   Moderate joint effusion. 2. Old mildly depressed lateral tibial plateau fracture. Electronically Signed   By: Obie DredgeWilliam T Derry M.D.   On: 04/01/2018 12:32   Dg Knee Complete 4 Views Left  Result Date: 04/01/2018 CLINICAL DATA:  Left knee pain, swelling EXAM: LEFT KNEE - COMPLETE 4+ VIEW COMPARISON:  05/01/2014.  Intraoperative images 712 2016 FINDINGS: Removal of prior hardware in the lateral proximal tibia. There is mild depression of the lateral tibial plateau, likely related to old healed fracture. No visible acute fracture. No visible joint effusion. IMPRESSION: Mild depression of the lateral tibial plateau, likely related to old fracture. No acute fracture noted. Electronically Signed   By: Charlett NoseKevin  Dover M.D.   On: 04/01/2018 11:15    ____________________________________________    PROCEDURES  Procedure(s) performed:    Procedures    Medications  oxyCODONE-acetaminophen (PERCOCET/ROXICET) 5-325 MG per tablet 1 tablet (1 tablet Oral Given 04/01/18 1147)  ketorolac (TORADOL) 30 MG/ML injection 30 mg (30 mg Intramuscular Given 04/01/18 1337)  ____________________________________________   INITIAL IMPRESSION / ASSESSMENT AND PLAN / ED COURSE  Pertinent labs & imaging results that were available during my care of the patient were reviewed by me and considered in my medical decision making (see chart for details).  Review of the [place] CSRS was performed in accordance of the [facility] prior to dispensing any controlled drugs.     Patient's diagnosis is consistent with joint effusion.  Vital signs and exam are reassuring.  CT consistent with effusion.  Patient may have a meniscus or ligament injury.  Knee was immobilized.  Crutches were given.  Patient will be discharged home with prescriptions for naprosyn and a short course of oxycodone. Patient is to follow up with Ortho as directed. Patient is given ED precautions to return to the ED for any worsening or new  symptoms.     ____________________________________________  FINAL CLINICAL IMPRESSION(S) / ED DIAGNOSES  Final diagnoses:  Effusion of left knee  Acute pain of left knee      NEW MEDICATIONS STARTED DURING THIS VISIT:  ED Discharge Orders         Ordered    naproxen (NAPROSYN) 500 MG tablet  2 times daily with meals     04/01/18 1342    oxyCODONE-acetaminophen (PERCOCET) 5-325 MG tablet  Every 4 hours PRN     04/01/18 1342              This chart was dictated using voice recognition software/Dragon. Despite best efforts to proofread, errors can occur which can change the meaning. Any change was purely unintentional.    Enid Derry, PA-C 04/01/18 1600    Arnaldo Natal, MD 04/08/18 [phone]

## 2018-06-30 DIAGNOSIS — M1732 Unilateral post-traumatic osteoarthritis, left knee: Secondary | ICD-10-CM | POA: Diagnosis not present

## 2018-09-16 ENCOUNTER — Other Ambulatory Visit: Payer: Self-pay

## 2018-09-16 ENCOUNTER — Encounter: Payer: Self-pay | Admitting: Student in an Organized Health Care Education/Training Program

## 2018-09-16 ENCOUNTER — Ambulatory Visit
Payer: Medicaid Other | Attending: Student in an Organized Health Care Education/Training Program | Admitting: Student in an Organized Health Care Education/Training Program

## 2018-09-16 VITALS — BP 112/83 | HR 84 | Temp 98.2°F | Resp 18 | Ht 72.0 in | Wt 166.4 lb

## 2018-09-16 DIAGNOSIS — M25562 Pain in left knee: Secondary | ICD-10-CM | POA: Insufficient documentation

## 2018-09-16 DIAGNOSIS — G8929 Other chronic pain: Secondary | ICD-10-CM | POA: Insufficient documentation

## 2018-09-16 DIAGNOSIS — S82142S Displaced bicondylar fracture of left tibia, sequela: Secondary | ICD-10-CM | POA: Insufficient documentation

## 2018-09-16 DIAGNOSIS — M25362 Other instability, left knee: Secondary | ICD-10-CM | POA: Insufficient documentation

## 2018-09-16 NOTE — Progress Notes (Signed)
Safety precautions to be maintained throughout the outpatient stay will include: orient to surroundings, keep bed in low position, maintain call bell within reach at all times, provide assistance with transfer out of bed and ambulation.  

## 2018-09-16 NOTE — Progress Notes (Signed)
Patient's Name: Andre Mclaughlin  MRN: [id number]  Referring Provider: Renata Caprice  DOB: 1975/12/01  PCP: Juline Patch, MD  DOS: 09/16/2018  Note by: Gillis Santa, MD  Service setting: Ambulatory outpatient  Specialty: Interventional Pain Management  Location: [facility] (AMB) Pain Management Facility  Visit type: Initial Patient Evaluation  Patient type: New Patient   Primary Reason(s) for Visit: Encounter for initial evaluation of one or more chronic problems (new to examiner) potentially causing chronic pain, and posing a threat to normal musculoskeletal function. (Level of risk: High) CC: Knee Pain (left)  HPI  Andre Mclaughlin is a 43 y.o. year old, male patient, who comes today to see [place] for the first time for an initial evaluation of his chronic pain. He has Tibial plateau fracture; Chronic pain of left knee; and Anterolateral rotary instability of left knee on their problem list. Today he comes in for evaluation of his Knee Pain (left)  Pain Assessment: Location: Left Knee Radiating: denies Onset: More than a month ago Duration: Chronic pain Quality: Constant, Aching, Shooting, Sharp("the more I am on my feet, long worse my knee feels") Severity: 7 /10 (subjective, self-reported pain score)  Note: Reported level is inconsistent with clinical observations. Clinically the patient looks like a 3/10 A 3/10 is viewed as "Moderate" and described as significantly interfering with activities of daily living (ADL). It becomes difficult to feed, bathe, get dressed, get on and off the toilet or to perform personal hygiene functions. Difficult to get in and out of bed or a chair without assistance. Very distracting. With effort, it can be ignored when deeply involved in activities. Information on the proper use of the pain scale provided to the patient today. When using our objective Pain Scale, levels between 6 and 10/10 are said to belong in an emergency room, as it progressively worsens from a  6/10, described as severely limiting, requiring emergency care not usually available at an outpatient pain management facility. At a 6/10 level, communication becomes difficult and requires great effort. Assistance to reach the emergency department may be required. Facial flushing and profuse sweating along with potentially dangerous increases in heart rate and blood pressure will be evident. Effect on ADL: limits daily activities, cannot squat at all Timing: Constant Modifying factors: nothing helps BP: 112/83  HR: 84  Onset and Duration: Gradual Cause of pain: fell on knee Severity: Getting worse, NAS-11 at its worse: 10/10, NAS-11 at its best: 4/10 and NAS-11 on the average: 9/10 Timing: Not influenced by the time of the day Aggravating Factors: Bending, Climbing, Intercourse (sex), Kneeling, Lifiting, Motion, Prolonged sitting, Prolonged standing, Squatting, Stooping , Twisting, Walking, Walking uphill and Walking downhill Alleviating Factors: Medications Associated Problems: Swelling, Weakness, Pain that wakes patient up and Pain that does not allow patient to sleep Quality of Pain: Aching, Agonizing, Annoying, Deep, Dreadful, Nagging, Pulsating, Shooting, Tender, Throbbing and Uncomfortable Previous Examinations or Tests: CT scan, MRI scan, X-rays and Orthopedic evaluation Previous Treatments: Morphine pump and Narcotic medications  The patient comes into the clinics today for the first time for a chronic pain management evaluation.   Left knee pain, severe.  Status post lateral tibial plateau ORIF in 2015, status post hardware removal.  Progressive pain along posterior lateral aspect of the left patella.  Endorses instability and popping sensation in his knee with knee giving out.  Does have swelling as the day progresses.  Pain is worse with weightbearing.  Is referred here by Dr. Rudene Christians for discussion  of genicular nerve block and possible cooled radiofrequency ablation.  Today I took the  time to provide the patient with information regarding my pain practice.   Because interventional pain management is my board-certified specialty, the patient was informed that joining my practice means that they are open to any and all interventional therapies. I made it clear that this does not mean that they will be forced to have any procedures done. What this means is that I believe interventional therapies to be essential part of the diagnosis and proper management of chronic pain conditions. Therefore, patients not interested in these interventional alternatives will be better served under the care of a different practitioner.  The patient was also made aware of my Comprehensive Pain Management Safety Guidelines where by joining my practice, they limit all of their nerve blocks and joint injections to those done by our practice, for as long as we are retained to manage their care.   Meds   Current Outpatient Medications:  .  famotidine (PEPCID) 10 MG tablet, Take 10 mg by mouth as needed for heartburn or indigestion., Disp: , Rfl:  .  naproxen (NAPROSYN) 500 MG tablet, Take 1 tablet (500 mg total) by mouth 2 (two) times daily with a meal., Disp: 30 tablet, Rfl: 0 .  ondansetron (ZOFRAN ODT) 4 MG disintegrating tablet, Take 1 tablet (4 mg total) by mouth every 8 (eight) hours as needed for nausea or vomiting., Disp: 12 tablet, Rfl: 0 .  oxyCODONE-acetaminophen (PERCOCET) 5-325 MG tablet, Take 1 tablet by mouth every 4 (four) hours as needed for severe pain., Disp: 6 tablet, Rfl: 0 .  naproxen sodium (RA NAPROXEN SODIUM) 220 MG tablet, Take 2 tablets by mouth every 6 (six) hours as needed., Disp: , Rfl:   Imaging Review  Cervical Imaging:  Cervical DG complete:  Results for orders placed during the hospital encounter of 10/30/11  DG Cervical Spine Complete   Narrative *RADIOLOGY REPORT*  Clinical Data: Posterior neck pain for 1 week.  Had whiplash type injury 1 week ago.  CERVICAL  SPINE - COMPLETE 4+ VIEW  Comparison: None.  Findings: There is reversal of the normal cervical lordosis.  The cervical spine vertebral bodies are normally aligned from the skull base through the T2 vertebral body.  Vertebral bodies are normal in height.  The facet joints are aligned.  The disc spaces are maintained and the neural foramen are patent bilaterally.  Lateral masses of C1-C2 are aligned.  No acute fracture is identified. Prevertebral soft tissue contour is within normal limits.  The tracheal column is unremarkable.  IMPRESSION:  1.  Reversal of the normal cervical lordosis.  This can be seen in the setting of muscle spasm. 2.  No acute bony abnormality identified.  Original Report Authenticated By: Curlene Dolphin, M.D.   Knee-L CT wo contrast:  Results for orders placed during the hospital encounter of 04/01/18  CT Knee Left Wo Contrast   Narrative CLINICAL DATA:  Left knee pain and swelling after feeling a pop this morning. History of prior tibial plateau fracture.  EXAM: CT OF THE LEFT KNEE WITHOUT CONTRAST  TECHNIQUE: Multidetector CT imaging of the left knee was performed according to the standard protocol. Multiplanar CT image reconstructions were also generated.  COMPARISON:  Left knee x-rays from same day. Intraoperative x-rays dated February 22, 2015 and May 04, 2014.  FINDINGS: Bones/Joint/Cartilage  No acute fracture or dislocation. Mild lateral subluxation of the patella. Moderate joint effusion. Chronic mildly depressed lateral tibial  plateau fracture with ghost tracks related to prior hardware. Joint spaces are preserved. Tiny marginal tricompartmental osteophytes.  Ligaments  Suboptimally assessed by CT.  Muscles and Tendons  The extensor mechanism is intact.  No muscle atrophy.  Soft tissues  No soft tissue mass or fluid collection.  No hematoma.  IMPRESSION: 1.  No acute osseous abnormality.  Moderate joint effusion. 2. Old  mildly depressed lateral tibial plateau fracture.   Electronically Signed   By: Titus Dubin M.D.   On: 04/01/2018 12:32     Results for orders placed during the hospital encounter of 04/01/18  DG Knee Complete 4 Views Left   Narrative CLINICAL DATA:  Left knee pain, swelling  EXAM: LEFT KNEE - COMPLETE 4+ VIEW  COMPARISON:  05/01/2014.  Intraoperative images 712 2016  FINDINGS: Removal of prior hardware in the lateral proximal tibia. There is mild depression of the lateral tibial plateau, likely related to old healed fracture. No visible acute fracture. No visible joint effusion.  IMPRESSION: Mild depression of the lateral tibial plateau, likely related to old fracture. No acute fracture noted.   Electronically Signed   By: Rolm Baptise M.D.   On: 04/01/2018 11:15     Hand Imaging: Hand-R DG Complete:  Results for orders placed in visit on 12/18/98  DG Hand Complete Right   Narrative FINDINGS CLINICAL DATA:   INJURY RIGHT HAND TWO VIEW: THERE IS A COMMINUTED FRACTURE OF THE DISTAL FIFTH METACARPAL WITH MILD TO MODERATE ANGULATION. THERE APPEARS TO BE SOME CALLOUS FORMATION BUT THERE IS NOT BONY UNION.  THIS FRACTURE WAS PRESENT ON X-RAYS OF 11-11-98 AND APPEARS THE SAME IN ALIGNMENT. IMPRESSION   Hand-L DG Complete:  Results for orders placed in visit on 11/11/98  DG Hand Complete Left   Narrative FINDINGS CLINICAL DATA:   BILATERAL LACERATIONS. RIGHT HAND: THREE VIEWS OF THE RIGHT WRIST DEMONSTRATE 5th METACARPAL NECK FRACTURE.  THERE IS APEX DORSAL ANGULATION.  JOINT SPACES ARE MAINTAINED AND NO OTHER BONY ABNORMALITIES ARE SEEN. NO RADIOPAQUE FOREIGN BODIES ARE IDENTIFIED. LEFT HAND: THREE VIEWS OF THE LEFT HAND DEMONSTRATE NO EVIDENCE OF ACUTE BONE ABNORMALITY OR ACUTE RADIOPAQUE FOREIGN BODY. IMPRESSION    Complexity Note: Imaging results reviewed. Results shared with Mr. Butch, using Layman's terms.                         ROS   Cardiovascular: High blood pressure Pulmonary or Respiratory: Smoking, Snoring  and Temporary stoppage of breathing during sleep Neurological: No reported neurological signs or symptoms such as seizures, abnormal skin sensations, urinary and/or fecal incontinence, being born with an abnormal open spine and/or a tethered spinal cord Review of Past Neurological Studies:  Results for orders placed or performed during the hospital encounter of 03/04/17  CT Head Wo Contrast   Narrative   CLINICAL DATA:  Headache and vomiting.  EXAM: CT HEAD WITHOUT CONTRAST  TECHNIQUE: Contiguous axial images were obtained from the base of the skull through the vertex without intravenous contrast.  COMPARISON:  None.  FINDINGS: Brain: No evidence of acute infarction, hemorrhage, hydrocephalus, extra-axial collection or mass lesion/mass effect.  Vascular: No hyperdense vessel or unexpected calcification.  Skull: Normal. Negative for fracture or focal lesion.  Sinuses/Orbits: Partial opacification of the right frontal sinus, right frontoethmoidal recess, and bilateral anterior ethmoid air cells. The remaining paranasal sinuses and mastoid air cells are clear.  Other: None.  IMPRESSION: 1.  No acute intracranial abnormality. 2. Partial opacification of  the right frontal sinus, right frontoethmoidal recess, and bilateral anterior ethmoid air cells. Correlate for signs and symptoms of acute sinusitis.   Electronically Signed   By: Titus Dubin M.D.   On: 03/04/2017 12:20    Psychological-Psychiatric: Difficulty sleeping and or falling asleep Gastrointestinal: Reflux or heatburn Genitourinary: No reported renal or genitourinary signs or symptoms such as difficulty voiding or producing urine, peeing blood, non-functioning kidney, kidney stones, difficulty emptying the bladder, difficulty controlling the flow of urine, or chronic kidney disease Hematological: No reported hematological signs or  symptoms such as prolonged bleeding, low or poor functioning platelets, bruising or bleeding easily, hereditary bleeding problems, low energy levels due to low hemoglobin or being anemic Endocrine: No reported endocrine signs or symptoms such as high or low blood sugar, rapid heart rate due to high thyroid levels, obesity or weight gain due to slow thyroid or thyroid disease Rheumatologic: No reported rheumatological signs and symptoms such as fatigue, joint pain, tenderness, swelling, redness, heat, stiffness, decreased range of motion, with or without associated rash Musculoskeletal: Negative for myasthenia gravis, muscular dystrophy, multiple sclerosis or malignant hyperthermia Work History: Quit going to work on his/her own  Allergies  Mr. Rabalais has No Known Allergies.  Laboratory Chemistry  Inflammation Markers (CRP: Acute Phase) (ESR: Chronic Phase) No results found for: CRP, ESRSEDRATE, LATICACIDVEN                       Rheumatology Markers No results found for: RF, ANA, LABURIC, URICUR, LYMEIGGIGMAB, LYMEABIGMQN, HLAB27                      Renal Function Markers Lab Results  Component Value Date   BUN 20 03/04/2017   CREATININE 1.19 03/04/2017   GFRAA >60 03/04/2017   GFRNONAA >60 03/04/2017                             Hepatic Function Markers Lab Results  Component Value Date   AST 32 03/04/2017   ALT 34 03/04/2017   ALBUMIN 4.3 03/04/2017   ALKPHOS 112 03/04/2017   LIPASE 32 03/04/2017                        Electrolytes Lab Results  Component Value Date   NA 140 03/04/2017   K 4.0 03/04/2017   CL 106 03/04/2017   CALCIUM 9.5 03/04/2017                        Neuropathy Markers No results found for: VITAMINB12, FOLATE, HGBA1C, HIV                      CNS Tests No results found for: COLORCSF, APPEARCSF, RBCCOUNTCSF, WBCCSF, POLYSCSF, LYMPHSCSF, EOSCSF, PROTEINCSF, GLUCCSF, JCVIRUS, CSFOLI, IGGCSF                      Bone Pathology Markers No results  found for: VD25OH, VD125OH2TOT, [id number], [id number], 25OHVITD1, 25OHVITD2, 25OHVITD3, TESTOFREE, TESTOSTERONE                       Coagulation Parameters Lab Results  Component Value Date   PLT 361 04/18/2017                        Cardiovascular Markers Lab Results  Component  Value Date   HGB 17.2 04/18/2017   HCT 51.1 (H) 04/18/2017                         CA Markers No results found for: CEA, CA125, LABCA2                      Endocrine Markers Lab Results  Component Value Date   TSH 0.928 04/18/2017                        Note: Lab results reviewed.  PFSH  Drug: Mr. Egolf  reports no history of drug use. Alcohol:  reports no history of alcohol use. Tobacco:  reports that he has been smoking cigarettes. He has a 30.00 pack-year smoking history. He has never used smokeless tobacco. Medical:  has a past medical history of Chronic kidney disease, GERD (gastroesophageal reflux disease), and Tobacco abuse. Family: family history is not on file.  Past Surgical History:  Procedure Laterality Date  . APPENDECTOMY    . HARDWARE REMOVAL Left 02/22/2015   Procedure: HARDWARE REMOVAL;  Surgeon: Hessie Knows, MD;  Location: [facility] ORS;  Service: Orthopedics;  Laterality: Left;  . ORIF TIBIA PLATEAU Left    Active Ambulatory Problems    Diagnosis Date Noted  . Tibial plateau fracture 02/15/2015  . Chronic pain of left knee 09/16/2018  . Anterolateral rotary instability of left knee 09/16/2018   Resolved Ambulatory Problems    Diagnosis Date Noted  . No Resolved Ambulatory Problems   Past Medical History:  Diagnosis Date  . Chronic kidney disease   . GERD (gastroesophageal reflux disease)   . Tobacco abuse    Constitutional Exam  General appearance: Well nourished, well developed, and well hydrated. In no apparent acute distress Vitals:   09/16/18 0850  BP: 112/83  Pulse: 84  Resp: 18  Temp: 98.2 F (36.8 C)  TempSrc: Oral  SpO2: 99%  Weight: 166 lb 6.4 oz (75.5  kg)  Height: 6' (1.829 m)   BMI Assessment: Estimated body mass index is 22.57 kg/m as calculated from the following:   Height as of this encounter: 6' (1.829 m).   Weight as of this encounter: 166 lb 6.4 oz (75.5 kg).  BMI interpretation table: BMI level Category Range association with higher incidence of chronic pain  <18 kg/m2 Underweight   18.5-24.9 kg/m2 Ideal body weight   25-29.9 kg/m2 Overweight Increased incidence by 20%  30-34.9 kg/m2 Obese (Class I) Increased incidence by 68%  35-39.9 kg/m2 Severe obesity (Class II) Increased incidence by 136%  >40 kg/m2 Extreme obesity (Class III) Increased incidence by 254%   Patient's current BMI Ideal Body weight  Body mass index is 22.57 kg/m. Ideal body weight: 77.6 kg (171 lb 1.2 oz)   BMI Readings from Last 4 Encounters:  09/16/18 22.57 kg/m  04/01/18 22.78 kg/m  04/29/17 23.73 kg/m  04/09/17 23.87 kg/m   Wt Readings from Last 4 Encounters:  09/16/18 166 lb 6.4 oz (75.5 kg)  04/01/18 168 lb (76.2 kg)  04/29/17 175 lb (79.4 kg)  04/09/17 176 lb (79.8 kg)  Psych/Mental status: Alert, oriented x 3 (person, place, & time)       Eyes: PERLA Respiratory: No evidence of acute respiratory distress  Cervical Spine Area Exam  Skin & Axial Inspection: No masses, redness, edema, swelling, or associated skin lesions Alignment: Symmetrical Functional ROM: Unrestricted ROM  Stability: No instability detected Muscle Tone/Strength: Functionally intact. No obvious neuro-muscular anomalies detected. Sensory (Neurological): Unimpaired Palpation: No palpable anomalies              Upper Extremity (UE) Exam    Side: Right upper extremity  Side: Left upper extremity  Skin & Extremity Inspection: Skin color, temperature, and hair growth are WNL. No peripheral edema or cyanosis. No masses, redness, swelling, asymmetry, or associated skin lesions. No contractures.  Skin & Extremity Inspection: Skin color, temperature, and hair growth  are WNL. No peripheral edema or cyanosis. No masses, redness, swelling, asymmetry, or associated skin lesions. No contractures.  Functional ROM: Unrestricted ROM          Functional ROM: Unrestricted ROM          Muscle Tone/Strength: Functionally intact. No obvious neuro-muscular anomalies detected.  Muscle Tone/Strength: Functionally intact. No obvious neuro-muscular anomalies detected.  Sensory (Neurological): Unimpaired          Sensory (Neurological): Unimpaired          Palpation: No palpable anomalies              Palpation: No palpable anomalies              Provocative Test(s):  Phalen's test: deferred Tinel's test: deferred Apley's scratch test (touch opposite shoulder):  Action 1 (Across chest): deferred Action 2 (Overhead): deferred Action 3 (LB reach): deferred   Provocative Test(s):  Phalen's test: deferred Tinel's test: deferred Apley's scratch test (touch opposite shoulder):  Action 1 (Across chest): deferred Action 2 (Overhead): deferred Action 3 (LB reach): deferred    Thoracic Spine Area Exam  Skin & Axial Inspection: No masses, redness, or swelling Alignment: Symmetrical Functional ROM: Unrestricted ROM Stability: No instability detected Muscle Tone/Strength: Functionally intact. No obvious neuro-muscular anomalies detected. Sensory (Neurological): Unimpaired Muscle strength & Tone: No palpable anomalies  Lumbar Spine Area Exam  Skin & Axial Inspection: No masses, redness, or swelling Alignment: Symmetrical Functional ROM: Unrestricted ROM       Stability: No instability detected Muscle Tone/Strength: Functionally intact. No obvious neuro-muscular anomalies detected. Sensory (Neurological): Unimpaired Palpation: No palpable anomalies       Provocative Tests: Hyperextension/rotation test: deferred today       Lumbar quadrant test (Kemp's test): deferred today       Lateral bending test: deferred today       Patrick's Maneuver: deferred today                    FABER* test: deferred today                   S-I anterior distraction/compression test: deferred today         S-I lateral compression test: deferred today         S-I Thigh-thrust test: deferred today         S-I Gaenslen's test: deferred today         *(Flexion, ABduction and External Rotation)  Gait & Posture Assessment  Ambulation: Unassisted Gait: Relatively normal for age and body habitus Posture: WNL   Lower Extremity Exam    Side: Right lower extremity  Side: Left lower extremity  Stability: No instability observed          Stability: Unstable for knee joint  Skin & Extremity Inspection: Skin color, temperature, and hair growth are WNL. No peripheral edema or cyanosis. No masses, redness, swelling, asymmetry, or associated skin lesions. No contractures.  Skin &  Extremity Inspection: Evidence of prior arthroplastic surgery  Functional ROM: Unrestricted ROM                  Functional ROM: Decreased ROM for knee joint          Muscle Tone/Strength: Functionally intact. No obvious neuro-muscular anomalies detected.  Muscle Tone/Strength: Functionally intact. No obvious neuro-muscular anomalies detected.  Sensory (Neurological): Unimpaired        Sensory (Neurological): Arthropathic arthralgia        DTR: Patellar: 2+: normal Achilles: deferred today Plantar: deferred today  DTR: Patellar: 1+: trace Achilles: 1+: trace Plantar: deferred today  Palpation: No palpable anomalies  Palpation: No palpable anomalies   Assessment  Primary Diagnosis & Pertinent Problem List: The primary encounter diagnosis was Chronic pain of left knee. Diagnoses of Closed fracture of left tibial plateau, sequela and Anterolateral rotary instability of left knee were also pertinent to this visit.  Visit Diagnosis (New problems to examiner): 1. Chronic pain of left knee   2. Closed fracture of left tibial plateau, sequela   3. Anterolateral rotary instability of left knee    Plan of Care  (Initial workup plan)   Risks and benefits of diagnostic left knee genicular nerve block followed by possibly left cooled radiofrequency ablation discussed with patient.  Patient denies being on any blood thinners.  Patient would like to proceed.  Ordered Lab-work, Procedure(s), Referral(s), & Consult(s): Orders Placed This Encounter  Procedures  . GENICULAR NERVE BLOCK   Provider-requested follow-up: Return for Procedure.  Future Appointments  Date Time Provider [place]  09/17/2018  9:30 AM Gillis Santa, MD [facility] None    Primary Care Physician: Juline Patch, MD Location: [facility] Note by: Gillis Santa, M.D, Date: 09/16/2018; Time: 10:08 AM  Patient Instructions   GENERAL RISKS AND COMPLICATIONS  What are the risk, side effects and possible complications? Generally speaking, most procedures are safe.  However, with any procedure there are risks, side effects, and the possibility of complications.  The risks and complications are dependent upon the sites that are lesioned, or the type of nerve block to be performed.  The closer the procedure is to the spine, the more serious the risks are.  Great care is taken when placing the radio frequency needles, block needles or lesioning probes, but sometimes complications can occur. 1. Infection: Any time there is an injection through the skin, there is a risk of infection.  This is why sterile conditions are used for these blocks.  There are four possible types of infection. 1. Localized skin infection. 2. Central Nervous System Infection-This can be in the form of Meningitis, which can be deadly. 3. Epidural Infections-This can be in the form of an epidural abscess, which can cause pressure inside of the spine, causing compression of the spinal cord with subsequent paralysis. This would require an emergency surgery to decompress, and there are no guarantees that the patient would recover from the  paralysis. 4. Discitis-This is an infection of the intervertebral discs.  It occurs in about 1% of discography procedures.  It is difficult to treat and it may lead to surgery.        2. Pain: the needles have to go through skin and soft tissues, will cause soreness.       3. Damage to internal structures:  The nerves to be lesioned may be near blood vessels or    other nerves which can be potentially damaged.  4. Bleeding: Bleeding is more common if the patient is taking blood thinners such as  aspirin, Coumadin, Ticiid, Plavix, etc., or if he/she have some genetic predisposition  such as hemophilia. Bleeding into the spinal canal can cause compression of the spinal  cord with subsequent paralysis.  This would require an emergency surgery to  decompress and there are no guarantees that the patient would recover from the  paralysis.       5. Pneumothorax:  Puncturing of a lung is a possibility, every time a needle is introduced in  the area of the chest or upper back.  Pneumothorax refers to free air around the  collapsed lung(s), inside of the thoracic cavity (chest cavity).  Another two possible  complications related to a similar event would include: Hemothorax and Chylothorax.   These are variations of the Pneumothorax, where instead of air around the collapsed  lung(s), you may have blood or chyle, respectively.       6. Spinal headaches: They may occur with any procedures in the area of the spine.       7. Persistent CSF (Cerebro-Spinal Fluid) leakage: This is a rare problem, but may occur  with prolonged intrathecal or epidural catheters either due to the formation of a fistulous  track or a dural tear.       8. Nerve damage: By working so close to the spinal cord, there is always a possibility of  nerve damage, which could be as serious as a permanent spinal cord injury with  paralysis.       9. Death:  Although rare, severe deadly allergic reactions known as "Anaphylactic  reaction" can  occur to any of the medications used.      10. Worsening of the symptoms:  We can always make thing worse.  What are the chances of something like this happening? Chances of any of this occuring are extremely low.  By statistics, you have more of a chance of getting killed in a motor vehicle accident: while driving to the hospital than any of the above occurring .  Nevertheless, you should be aware that they are possibilities.  In general, it is similar to taking a shower.  Everybody knows that you can slip, hit your head and get killed.  Does that mean that you should not shower again?  Nevertheless always keep in mind that statistics do not mean anything if you happen to be on the wrong side of them.  Even if a procedure has a 1 (one) in a 1,000,000 (million) chance of going wrong, it you happen to be that one..Also, keep in mind that by statistics, you have more of a chance of having something go wrong when taking medications.  Who should not have this procedure? If you are on a blood thinning medication (e.g. Coumadin, Plavix, see list of "Blood Thinners"), or if you have an active infection going on, you should not have the procedure.  If you are taking any blood thinners, please inform your physician.  How should I prepare for this procedure?  Do not eat or drink anything at least six hours prior to the procedure.  Bring a driver with you .  It cannot be a taxi.  Come accompanied by an adult that can drive you back, and that is strong enough to help you if your legs get weak or numb from the local anesthetic.  Take all of your medicines the morning of the procedure with just enough water to  swallow them.  If you have diabetes, make sure that you are scheduled to have your procedure done first thing in the morning, whenever possible.  If you have diabetes, take only half of your insulin dose and notify our nurse that you have done so as soon as you arrive at the clinic.  If you are  diabetic, but only take blood sugar pills (oral hypoglycemic), then do not take them on the morning of your procedure.  You may take them after you have had the procedure.  Do not take aspirin or any aspirin-containing medications, at least eleven (11) days prior to the procedure.  They may prolong bleeding.  Wear loose fitting clothing that may be easy to take off and that you would not mind if it got stained with Betadine or blood.  Do not wear any jewelry or perfume  Remove any nail coloring.  It will interfere with some of our monitoring equipment.  NOTE: Remember that this is not meant to be interpreted as a complete list of all possible complications.  Unforeseen problems may occur.  BLOOD THINNERS The following drugs contain aspirin or other products, which can cause increased bleeding during surgery and should not be taken for 2 weeks prior to and 1 week after surgery.  If you should need take something for relief of minor pain, you may take acetaminophen which is found in Tylenol,m Datril, Anacin-3 and Panadol. It is not blood thinner. The products listed below are.  Do not take any of the products listed below in addition to any listed on your instruction sheet.  A.P.C or A.P.C with Codeine Codeine Phosphate Capsules #3 Ibuprofen Ridaura  ABC compound Congesprin Imuran rimadil  Advil Cope Indocin Robaxisal  Alka-Seltzer Effervescent Pain Reliever and Antacid Coricidin or Coricidin-D  Indomethacin Rufen  Alka-Seltzer plus Cold Medicine Cosprin Ketoprofen S-A-C Tablets  Anacin Analgesic Tablets or Capsules Coumadin Korlgesic Salflex  Anacin Extra Strength Analgesic tablets or capsules CP-2 Tablets Lanoril Salicylate  Anaprox Cuprimine Capsules Levenox Salocol  Anexsia-D Dalteparin Magan Salsalate  Anodynos Darvon compound Magnesium Salicylate Sine-off  Ansaid Dasin Capsules Magsal Sodium Salicylate  Anturane Depen Capsules Marnal Soma  APF Arthritis pain formula Dewitt's Pills  Measurin Stanback  Argesic Dia-Gesic Meclofenamic Sulfinpyrazone  Arthritis Bayer Timed Release Aspirin Diclofenac Meclomen Sulindac  Arthritis pain formula Anacin Dicumarol Medipren Supac  Analgesic (Safety coated) Arthralgen Diffunasal Mefanamic Suprofen  Arthritis Strength Bufferin Dihydrocodeine Mepro Compound Suprol  Arthropan liquid Dopirydamole Methcarbomol with Aspirin Synalgos  ASA tablets/Enseals Disalcid Micrainin Tagament  Ascriptin Doan's Midol Talwin  Ascriptin A/D Dolene Mobidin Tanderil  Ascriptin Extra Strength Dolobid Moblgesic Ticlid  Ascriptin with Codeine Doloprin or Doloprin with Codeine Momentum Tolectin  Asperbuf Duoprin Mono-gesic Trendar  Aspergum Duradyne Motrin or Motrin IB Triminicin  Aspirin plain, buffered or enteric coated Durasal Myochrisine Trigesic  Aspirin Suppositories Easprin Nalfon Trillsate  Aspirin with Codeine Ecotrin Regular or Extra Strength Naprosyn Uracel  Atromid-S Efficin Naproxen Ursinus  Auranofin Capsules Elmiron Neocylate Vanquish  Axotal Emagrin Norgesic Verin  Azathioprine Empirin or Empirin with Codeine Normiflo Vitamin E  Azolid Emprazil Nuprin Voltaren  Bayer Aspirin plain, buffered or children's or timed BC Tablets or powders Encaprin Orgaran Warfarin Sodium  Buff-a-Comp Enoxaparin Orudis Zorpin  Buff-a-Comp with Codeine Equegesic Os-Cal-Gesic   Buffaprin Excedrin plain, buffered or Extra Strength Oxalid   Bufferin Arthritis Strength Feldene Oxphenbutazone   Bufferin plain or Extra Strength Feldene Capsules Oxycodone with Aspirin   Bufferin with Codeine Fenoprofen Fenoprofen Pabalate or Pabalate-SF  Buffets II Flogesic Panagesic   Buffinol plain or Extra Strength Florinal or Florinal with Codeine Panwarfarin   Buf-Tabs Flurbiprofen Penicillamine   Butalbital Compound Four-way cold tablets Penicillin   Butazolidin Fragmin Pepto-Bismol   Carbenicillin Geminisyn Percodan   Carna Arthritis Reliever Geopen Persantine    Carprofen Gold's salt Persistin   Chloramphenicol Goody's Phenylbutazone   Chloromycetin Haltrain Piroxlcam   Clmetidine heparin Plaquenil   Cllnoril Hyco-pap Ponstel   Clofibrate Hydroxy chloroquine Propoxyphen         Before stopping any of these medications, be sure to consult the physician who ordered them.  Some, such as Coumadin (Warfarin) are ordered to prevent or treat serious conditions such as "deep thrombosis", "pumonary embolisms", and other heart problems.  The amount of time that you may need off of the medication may also vary with the medication and the reason for which you were taking it.  If you are taking any of these medications, please make sure you notify your pain physician before you undergo any procedures.         Moderate Conscious Sedation, Adult Sedation is the use of medicines to promote relaxation and relieve discomfort and anxiety. Moderate conscious sedation is a type of sedation. Under moderate conscious sedation, you are less alert than normal, but you are still able to respond to instructions, touch, or both. Moderate conscious sedation is used during short medical and dental procedures. It is milder than deep sedation, which is a type of sedation under which you cannot be easily woken up. It is also milder than general anesthesia, which is the use of medicines to make you unconscious. Moderate conscious sedation allows you to return to your regular activities sooner. Tell a health care provider about:  Any allergies you have.  All medicines you are taking, including vitamins, herbs, eye drops, creams, and over-the-counter medicines.  Use of steroids (by mouth or creams).  Any problems you or family members have had with sedatives and anesthetic medicines.  Any blood disorders you have.  Any surgeries you have had.  Any medical conditions you have, such as sleep apnea.  Whether you are pregnant or may be pregnant.  Any use of cigarettes,  alcohol, marijuana, or street drugs. What are the risks? Generally, this is a safe procedure. However, problems may occur, including:  Getting too much medicine (oversedation).  Nausea.  Allergic reaction to medicines.  Trouble breathing. If this happens, a breathing tube may be used to help with breathing. It will be removed when you are awake and breathing on your own.  Heart trouble.  Lung trouble. What happens before the procedure? Staying hydrated Follow instructions from your health care provider about hydration, which may include:  Up to 2 hours before the procedure - you may continue to drink clear liquids, such as water, clear fruit juice, black coffee, and plain tea. Eating and drinking restrictions Follow instructions from your health care provider about eating and drinking, which may include:  8 hours before the procedure - stop eating heavy meals or foods such as meat, fried foods, or fatty foods.  6 hours before the procedure - stop eating light meals or foods, such as toast or cereal.  6 hours before the procedure - stop drinking milk or drinks that contain milk.  2 hours before the procedure - stop drinking clear liquids. Medicine Ask your health care provider about:  Changing or stopping your regular medicines. This is especially important if you are taking diabetes medicines or  blood thinners.  Taking medicines such as aspirin and ibuprofen. These medicines can thin your blood. Do not take these medicines before your procedure if your health care provider instructs you not to.  Tests and exams  You will have a physical exam.  You may have blood tests done to show: ? How well your kidneys and liver are working. ? How well your blood can clot. General instructions  Plan to have someone take you home from the hospital or clinic.  If you will be going home right after the procedure, plan to have someone with you for 24 hours. What happens during the  procedure?  An IV tube will be inserted into one of your veins.  Medicine to help you relax (sedative) will be given through the IV tube.  The medical or dental procedure will be performed. What happens after the procedure?  Your blood pressure, heart rate, breathing rate, and blood oxygen level will be monitored often until the medicines you were given have worn off.  Do not drive for 24 hours. This information is not intended to replace advice given to you by your health care provider. Make sure you discuss any questions you have with your health care provider. Document Released: 04/24/2001 Document Revised: 01/03/2016 Document Reviewed: 11/19/2015 [employer] Interactive Patient Education  2019 [employer].

## 2018-09-16 NOTE — Patient Instructions (Signed)
GENERAL RISKS AND COMPLICATIONS  What are the risk, side effects and possible complications? Generally speaking, most procedures are safe.  However, with any procedure there are risks, side effects, and the possibility of complications.  The risks and complications are dependent upon the sites that are lesioned, or the type of nerve block to be performed.  The closer the procedure is to the spine, the more serious the risks are.  Great care is taken when placing the radio frequency needles, block needles or lesioning probes, but sometimes complications can occur. 1. Infection: Any time there is an injection through the skin, there is a risk of infection.  This is why sterile conditions are used for these blocks.  There are four possible types of infection. 1. Localized skin infection. 2. Central Nervous System Infection-This can be in the form of Meningitis, which can be deadly. 3. Epidural Infections-This can be in the form of an epidural abscess, which can cause pressure inside of the spine, causing compression of the spinal cord with subsequent paralysis. This would require an emergency surgery to decompress, and there are no guarantees that the patient would recover from the paralysis. 4. Discitis-This is an infection of the intervertebral discs.  It occurs in about 1% of discography procedures.  It is difficult to treat and it may lead to surgery.        2. Pain: the needles have to go through skin and soft tissues, will cause soreness.       3. Damage to internal structures:  The nerves to be lesioned may be near blood vessels or    other nerves which can be potentially damaged.       4. Bleeding: Bleeding is more common if the patient is taking blood thinners such as  aspirin, Coumadin, Ticiid, Plavix, etc., or if he/she have some genetic predisposition  such as hemophilia. Bleeding into the spinal canal can cause compression of the spinal  cord with subsequent paralysis.  This would require an  emergency surgery to  decompress and there are no guarantees that the patient would recover from the  paralysis.       5. Pneumothorax:  Puncturing of a lung is a possibility, every time a needle is introduced in  the area of the chest or upper back.  Pneumothorax refers to free air around the  collapsed lung(s), inside of the thoracic cavity (chest cavity).  Another two possible  complications related to a similar event would include: Hemothorax and Chylothorax.   These are variations of the Pneumothorax, where instead of air around the collapsed  lung(s), you may have blood or chyle, respectively.       6. Spinal headaches: They may occur with any procedures in the area of the spine.       7. Persistent CSF (Cerebro-Spinal Fluid) leakage: This is a rare problem, but may occur  with prolonged intrathecal or epidural catheters either due to the formation of a fistulous  track or a dural tear.       8. Nerve damage: By working so close to the spinal cord, there is always a possibility of  nerve damage, which could be as serious as a permanent spinal cord injury with  paralysis.       9. Death:  Although rare, severe deadly allergic reactions known as "Anaphylactic  reaction" can occur to any of the medications used.      10. Worsening of the symptoms:  We can always make thing worse.    What are the chances of something like this happening? Chances of any of this occuring are extremely low.  By statistics, you have more of a chance of getting killed in a motor vehicle accident: while driving to the hospital than any of the above occurring .  Nevertheless, you should be aware that they are possibilities.  In general, it is similar to taking a shower.  Everybody knows that you can slip, hit your head and get killed.  Does that mean that you should not shower again?  Nevertheless always keep in mind that statistics do not mean anything if you happen to be on the wrong side of them.  Even if a procedure has a 1  (one) in a 1,000,000 (million) chance of going wrong, it you happen to be that one..Also, keep in mind that by statistics, you have more of a chance of having something go wrong when taking medications.  Who should not have this procedure? If you are on a blood thinning medication (e.g. Coumadin, Plavix, see list of "Blood Thinners"), or if you have an active infection going on, you should not have the procedure.  If you are taking any blood thinners, please inform your physician.  How should I prepare for this procedure?  Do not eat or drink anything at least six hours prior to the procedure.  Bring a driver with you .  It cannot be a taxi.  Come accompanied by an adult that can drive you back, and that is strong enough to help you if your legs get weak or numb from the local anesthetic.  Take all of your medicines the morning of the procedure with just enough water to swallow them.  If you have diabetes, make sure that you are scheduled to have your procedure done first thing in the morning, whenever possible.  If you have diabetes, take only half of your insulin dose and notify our nurse that you have done so as soon as you arrive at the clinic.  If you are diabetic, but only take blood sugar pills (oral hypoglycemic), then do not take them on the morning of your procedure.  You may take them after you have had the procedure.  Do not take aspirin or any aspirin-containing medications, at least eleven (11) days prior to the procedure.  They may prolong bleeding.  Wear loose fitting clothing that may be easy to take off and that you would not mind if it got stained with Betadine or blood.  Do not wear any jewelry or perfume  Remove any nail coloring.  It will interfere with some of our monitoring equipment.  NOTE: Remember that this is not meant to be interpreted as a complete list of all possible complications.  Unforeseen problems may occur.  BLOOD THINNERS The following drugs  contain aspirin or other products, which can cause increased bleeding during surgery and should not be taken for 2 weeks prior to and 1 week after surgery.  If you should need take something for relief of minor pain, you may take acetaminophen which is found in Tylenol,m Datril, Anacin-3 and Panadol. It is not blood thinner. The products listed below are.  Do not take any of the products listed below in addition to any listed on your instruction sheet.  A.P.C or A.P.C with Codeine Codeine Phosphate Capsules #3 Ibuprofen Ridaura  ABC compound Congesprin Imuran rimadil  Advil Cope Indocin Robaxisal  Alka-Seltzer Effervescent Pain Reliever and Antacid Coricidin or Coricidin-D  Indomethacin Rufen    Alka-Seltzer plus Cold Medicine Cosprin Ketoprofen S-A-C Tablets  Anacin Analgesic Tablets or Capsules Coumadin Korlgesic Salflex  Anacin Extra Strength Analgesic tablets or capsules CP-2 Tablets Lanoril Salicylate  Anaprox Cuprimine Capsules Levenox Salocol  Anexsia-D Dalteparin Magan Salsalate  Anodynos Darvon compound Magnesium Salicylate Sine-off  Ansaid Dasin Capsules Magsal Sodium Salicylate  Anturane Depen Capsules Marnal Soma  APF Arthritis pain formula Dewitt's Pills Measurin Stanback  Argesic Dia-Gesic Meclofenamic Sulfinpyrazone  Arthritis Bayer Timed Release Aspirin Diclofenac Meclomen Sulindac  Arthritis pain formula Anacin Dicumarol Medipren Supac  Analgesic (Safety coated) Arthralgen Diffunasal Mefanamic Suprofen  Arthritis Strength Bufferin Dihydrocodeine Mepro Compound Suprol  Arthropan liquid Dopirydamole Methcarbomol with Aspirin Synalgos  ASA tablets/Enseals Disalcid Micrainin Tagament  Ascriptin Doan's Midol Talwin  Ascriptin A/D Dolene Mobidin Tanderil  Ascriptin Extra Strength Dolobid Moblgesic Ticlid  Ascriptin with Codeine Doloprin or Doloprin with Codeine Momentum Tolectin  Asperbuf Duoprin Mono-gesic Trendar  Aspergum Duradyne Motrin or Motrin IB Triminicin  Aspirin  plain, buffered or enteric coated Durasal Myochrisine Trigesic  Aspirin Suppositories Easprin Nalfon Trillsate  Aspirin with Codeine Ecotrin Regular or Extra Strength Naprosyn Uracel  Atromid-S Efficin Naproxen Ursinus  Auranofin Capsules Elmiron Neocylate Vanquish  Axotal Emagrin Norgesic Verin  Azathioprine Empirin or Empirin with Codeine Normiflo Vitamin E  Azolid Emprazil Nuprin Voltaren  Bayer Aspirin plain, buffered or children's or timed BC Tablets or powders Encaprin Orgaran Warfarin Sodium  Buff-a-Comp Enoxaparin Orudis Zorpin  Buff-a-Comp with Codeine Equegesic Os-Cal-Gesic   Buffaprin Excedrin plain, buffered or Extra Strength Oxalid   Bufferin Arthritis Strength Feldene Oxphenbutazone   Bufferin plain or Extra Strength Feldene Capsules Oxycodone with Aspirin   Bufferin with Codeine Fenoprofen Fenoprofen Pabalate or Pabalate-SF   Buffets II Flogesic Panagesic   Buffinol plain or Extra Strength Florinal or Florinal with Codeine Panwarfarin   Buf-Tabs Flurbiprofen Penicillamine   Butalbital Compound Four-way cold tablets Penicillin   Butazolidin Fragmin Pepto-Bismol   Carbenicillin Geminisyn Percodan   Carna Arthritis Reliever Geopen Persantine   Carprofen Gold's salt Persistin   Chloramphenicol Goody's Phenylbutazone   Chloromycetin Haltrain Piroxlcam   Clmetidine heparin Plaquenil   Cllnoril Hyco-pap Ponstel   Clofibrate Hydroxy chloroquine Propoxyphen         Before stopping any of these medications, be sure to consult the physician who ordered them.  Some, such as Coumadin (Warfarin) are ordered to prevent or treat serious conditions such as "deep thrombosis", "pumonary embolisms", and other heart problems.  The amount of time that you may need off of the medication may also vary with the medication and the reason for which you were taking it.  If you are taking any of these medications, please make sure you notify your pain physician before you undergo any  procedures.   Moderate Conscious Sedation, Adult Sedation is the use of medicines to promote relaxation and relieve discomfort and anxiety. Moderate conscious sedation is a type of sedation. Under moderate conscious sedation, you are less alert than normal, but you are still able to respond to instructions, touch, or both. Moderate conscious sedation is used during short medical and dental procedures. It is milder than deep sedation, which is a type of sedation under which you cannot be easily woken up. It is also milder than general anesthesia, which is the use of medicines to make you unconscious. Moderate conscious sedation allows you to return to your regular activities sooner. Tell a health care provider about:  Any allergies you have.  All medicines you are taking, including vitamins, herbs, eye drops,   creams, and over-the-counter medicines.  Use of steroids (by mouth or creams).  Any problems you or family members have had with sedatives and anesthetic medicines.  Any blood disorders you have.  Any surgeries you have had.  Any medical conditions you have, such as sleep apnea.  Whether you are pregnant or may be pregnant.  Any use of cigarettes, alcohol, marijuana, or street drugs. What are the risks? Generally, this is a safe procedure. However, problems may occur, including:  Getting too much medicine (oversedation).  Nausea.  Allergic reaction to medicines.  Trouble breathing. If this happens, a breathing tube may be used to help with breathing. It will be removed when you are awake and breathing on your own.  Heart trouble.  Lung trouble. What happens before the procedure? Staying hydrated Follow instructions from your health care provider about hydration, which may include:  Up to 2 hours before the procedure - you may continue to drink clear liquids, such as water, clear fruit juice, black coffee, and plain tea. Eating and drinking restrictions Follow  instructions from your health care provider about eating and drinking, which may include:  8 hours before the procedure - stop eating heavy meals or foods such as meat, fried foods, or fatty foods.  6 hours before the procedure - stop eating light meals or foods, such as toast or cereal.  6 hours before the procedure - stop drinking milk or drinks that contain milk.  2 hours before the procedure - stop drinking clear liquids. Medicine Ask your health care provider about:  Changing or stopping your regular medicines. This is especially important if you are taking diabetes medicines or blood thinners.  Taking medicines such as aspirin and ibuprofen. These medicines can thin your blood. Do not take these medicines before your procedure if your health care provider instructs you not to.  Tests and exams  You will have a physical exam.  You may have blood tests done to show: ? How well your kidneys and liver are working. ? How well your blood can clot. General instructions  Plan to have someone take you home from the hospital or clinic.  If you will be going home right after the procedure, plan to have someone with you for 24 hours. What happens during the procedure?  An IV tube will be inserted into one of your veins.  Medicine to help you relax (sedative) will be given through the IV tube.  The medical or dental procedure will be performed. What happens after the procedure?  Your blood pressure, heart rate, breathing rate, and blood oxygen level will be monitored often until the medicines you were given have worn off.  Do not drive for 24 hours. This information is not intended to replace advice given to you by your health care provider. Make sure you discuss any questions you have with your health care provider. Document Released: 04/24/2001 Document Revised: 01/03/2016 Document Reviewed: 11/19/2015 [employer] Interactive Patient Education  2019 [employer]  

## 2018-09-17 ENCOUNTER — Other Ambulatory Visit: Payer: Self-pay

## 2018-09-17 ENCOUNTER — Ambulatory Visit (HOSPITAL_BASED_OUTPATIENT_CLINIC_OR_DEPARTMENT_OTHER): Payer: Medicaid Other | Admitting: Student in an Organized Health Care Education/Training Program

## 2018-09-17 ENCOUNTER — Ambulatory Visit
Admission: RE | Admit: 2018-09-17 | Discharge: 2018-09-17 | Disposition: A | Discharge status: Home / Self Care | Payer: Medicaid Other | Source: Ambulatory Visit | Attending: Student in an Organized Health Care Education/Training Program | Admitting: Student in an Organized Health Care Education/Training Program

## 2018-09-17 ENCOUNTER — Encounter: Payer: Self-pay | Admitting: Student in an Organized Health Care Education/Training Program

## 2018-09-17 VITALS — BP 115/74 | HR 79 | Temp 99.0°F | Resp 16 | Ht 72.0 in | Wt 168.0 lb

## 2018-09-17 DIAGNOSIS — G8929 Other chronic pain: Secondary | ICD-10-CM

## 2018-09-17 DIAGNOSIS — M25562 Pain in left knee: Secondary | ICD-10-CM

## 2018-09-17 DIAGNOSIS — S82142S Displaced bicondylar fracture of left tibia, sequela: Secondary | ICD-10-CM | POA: Insufficient documentation

## 2018-09-17 MED ORDER — LIDOCAINE HCL 2 % IJ SOLN
INTRAMUSCULAR | Status: AC
  Filled 2018-09-17: qty 20

## 2018-09-17 MED ORDER — ROPIVACAINE HCL 2 MG/ML IJ SOLN
INTRAMUSCULAR | Status: AC
  Filled 2018-09-17: qty 10

## 2018-09-17 MED ORDER — DEXAMETHASONE SODIUM PHOSPHATE 10 MG/ML IJ SOLN
10.0000 mg | Freq: Once | INTRAMUSCULAR | Status: AC
  Administered 2018-09-17: 10 mg

## 2018-09-17 MED ORDER — FENTANYL CITRATE (PF) 100 MCG/2ML IJ SOLN
INTRAMUSCULAR | Status: AC
  Filled 2018-09-17: qty 2

## 2018-09-17 MED ORDER — DEXAMETHASONE SODIUM PHOSPHATE 10 MG/ML IJ SOLN
INTRAMUSCULAR | Status: AC
  Filled 2018-09-17: qty 1

## 2018-09-17 MED ORDER — LACTATED RINGERS IV SOLN
1000.0000 mL | Freq: Once | INTRAVENOUS | Status: AC
  Administered 2018-09-17: 1000 mL via INTRAVENOUS

## 2018-09-17 MED ORDER — LIDOCAINE HCL 2 % IJ SOLN
20.0000 mL | Freq: Once | INTRAMUSCULAR | Status: AC
  Administered 2018-09-17: 400 mg

## 2018-09-17 MED ORDER — FENTANYL CITRATE (PF) 100 MCG/2ML IJ SOLN
25.0000 ug | INTRAMUSCULAR | Status: DC | PRN
  Administered 2018-09-17: 100 ug via INTRAVENOUS

## 2018-09-17 MED ORDER — ROPIVACAINE HCL 2 MG/ML IJ SOLN
10.0000 mL | Freq: Once | INTRAMUSCULAR | Status: AC
  Administered 2018-09-17: 10 mL

## 2018-09-17 NOTE — Patient Instructions (Signed)

## 2018-09-17 NOTE — Progress Notes (Signed)
Safety precautions to be maintained throughout the outpatient stay will include: orient to surroundings, keep bed in low position, maintain call bell within reach at all times, provide assistance with transfer out of bed and ambulation.  

## 2018-09-17 NOTE — Progress Notes (Signed)
Patient's Name: Andre Mclaughlin  MRN: [id number]  Referring Provider: Duanne LimerickJones, Deanna C, MD  DOB: 1975/10/22  PCP: Duanne LimerickJones, Deanna C, MD  DOS: 09/17/2018  Note by: Edward JollyBilal Castor Gittleman, MD  Service setting: Ambulatory outpatient  Specialty: Interventional Pain Management  Patient type: Established  Location: [facility] (AMB) Pain Management Facility  Visit type: Interventional Procedure   Primary Reason for Visit: Interventional Pain Management Treatment. CC: Knee Pain (left)  Procedure:          Anesthesia, Analgesia, Anxiolysis:  Type: Genicular Nerves Block (Superior-lateral, Superior-medial, and Inferior-medial Genicular Nerves)          CPT: 64450      Primary Purpose: Diagnostic Region: Lateral, Anterior, and Medial aspects of the knee joint, above and below the knee joint proper. Level: Superior and inferior to the knee joint. Target Area: For Genicular Nerve block(s), the targets are: the superior-lateral genicular nerve, located in the lateral distal portion of the femoral shaft as it curves to form the lateral epicondyle, in the region of the distal femoral metaphysis; the superior-medial genicular nerve, located in the medial distal portion of the femoral shaft as it curves to form the medial epicondyle; and the inferior-medial genicular nerve, located in the medial, proximal portion of the tibial shaft, as it curves to form the medial epicondyle, in the region of the proximal tibial metaphysis. Approach: Anterior, percutaneous, ipsilateral approach. Laterality: Left knee  Type: Moderate (Conscious) Sedation combined with Local Anesthesia Indication(s): Analgesia and Anxiety Route: Intravenous (IV) IV Access: Secured Sedation: Meaningful verbal contact was maintained at all times during the procedure  Local Anesthetic: Lidocaine 1-2%  Position: Modified Fowler's position with pillows under the targeted knee(s).   Indications: 1. Chronic pain of left knee   2. Closed fracture of left tibial  plateau, sequela    Pain Score: Pre-procedure: 6 /10 Post-procedure: 0-No pain/10  Pre-op Assessment:  Mr. Andre Mclaughlin is a 43 y.o. (year old), male patient, seen today for interventional treatment. He  has a past surgical history that includes Appendectomy; ORIF tibia plateau (Left); and Hardware Removal (Left, 02/22/2015). Mr. Andre Mclaughlin has a current medication list which includes the following prescription(s): famotidine, naproxen, naproxen sodium, ondansetron, and oxycodone-acetaminophen, and the following Facility-Administered Medications: fentanyl. His primarily concern today is the Knee Pain (left)  Initial Vital Signs:  Pulse/HCG Rate: 88ECG Heart Rate: 80 Temp: 98 F (36.7 C) Resp: 18 BP: 123/85 SpO2: 99 %  BMI: Estimated body mass index is 22.78 kg/m as calculated from the following:   Height as of this encounter: 6' (1.829 m).   Weight as of this encounter: 168 lb (76.2 kg).  Risk Assessment: Allergies: Reviewed. He has No Known Allergies.  Allergy Precautions: None required Coagulopathies: Reviewed. None identified.  Blood-thinner therapy: None at this time Active Infection(s): Reviewed. None identified. Mr. Andre Mclaughlin is afebrile  Site Confirmation: Mr. Andre Mclaughlin was asked to confirm the procedure and laterality before marking the site Procedure checklist: Completed Consent: Before the procedure and under the influence of no sedative(s), amnesic(s), or anxiolytics, the patient was informed of the treatment options, risks and possible complications. To fulfill our ethical and legal obligations, as recommended by the American Medical Association's Code of Ethics, I have informed the patient of my clinical impression; the nature and purpose of the treatment or procedure; the risks, benefits, and possible complications of the intervention; the alternatives, including doing nothing; the risk(s) and benefit(s) of the alternative treatment(s) or procedure(s); and the risk(s) and benefit(s) of doing  nothing. The  patient was provided information about the general risks and possible complications associated with the procedure. These may include, but are not limited to: failure to achieve desired goals, infection, bleeding, organ or nerve damage, allergic reactions, paralysis, and death. In addition, the patient was informed of those risks and complications associated to the procedure, such as failure to decrease pain; infection; bleeding; organ or nerve damage with subsequent damage to sensory, motor, and/or autonomic systems, resulting in permanent pain, numbness, and/or weakness of one or several areas of the body; allergic reactions; (i.e.: anaphylactic reaction); and/or death. Furthermore, the patient was informed of those risks and complications associated with the medications. These include, but are not limited to: allergic reactions (i.e.: anaphylactic or anaphylactoid reaction(s)); adrenal axis suppression; blood sugar elevation that in diabetics may result in ketoacidosis or comma; water retention that in patients with history of congestive heart failure may result in shortness of breath, pulmonary edema, and decompensation with resultant heart failure; weight gain; swelling or edema; medication-induced neural toxicity; particulate matter embolism and blood vessel occlusion with resultant organ, and/or nervous system infarction; and/or aseptic necrosis of one or more joints. Finally, the patient was informed that Medicine is not an exact science; therefore, there is also the possibility of unforeseen or unpredictable risks and/or possible complications that may result in a catastrophic outcome. The patient indicated having understood very clearly. We have given the patient no guarantees and we have made no promises. Enough time was given to the patient to ask questions, all of which were answered to the patient's satisfaction. Mr. Andre Mclaughlin has indicated that he wanted to continue with the  procedure. Attestation: I, the ordering provider, attest that I have discussed with the patient the benefits, risks, side-effects, alternatives, likelihood of achieving goals, and potential problems during recovery for the procedure that I have provided informed consent. Date  Time: 09/17/2018  9:29 AM  Pre-Procedure Preparation:  Monitoring: As per clinic protocol. Respiration, ETCO2, SpO2, BP, heart rate and rhythm monitor placed and checked for adequate function Safety Precautions: Patient was assessed for positional comfort and pressure points before starting the procedure. Time-out: I initiated and conducted the "Time-out" before starting the procedure, as per protocol. The patient was asked to participate by confirming the accuracy of the "Time Out" information. Verification of the correct person, site, and procedure were performed and confirmed by me, the nursing staff, and the patient. "Time-out" conducted as per Joint Commission's Universal Protocol (UP.01.01.01). Time: 1030  Description of Procedure:          Area Prepped: Entire knee area, from mid-thigh to mid-shin, lateral, anterior, and medial aspects. Prepping solution: ChloraPrep (2% chlorhexidine gluconate and 70% isopropyl alcohol) Safety Precautions: Aspiration looking for blood return was conducted prior to all injections. At no point did we inject any substances, as a needle was being advanced. No attempts were made at seeking any paresthesias. Safe injection practices and needle disposal techniques used. Medications properly checked for expiration dates. SDV (single dose vial) medications used. Description of the Procedure: Protocol guidelines were followed. The patient was placed in position over the procedure table. The target area was identified and the area prepped in the usual manner. Skin & deeper tissues infiltrated with local anesthetic. Appropriate amount of time allowed to pass for local anesthetics to take effect. The  procedure needles were then advanced to the target area. Proper needle placement secured. Negative aspiration confirmed. Solution injected in intermittent fashion, asking for systemic symptoms every 0.5cc of injectate. The needles were  then removed and the area cleansed, making sure to leave some of the prepping solution back to take advantage of its long term bactericidal properties.  Vitals:   09/17/18 1040 09/17/18 1050 09/17/18 1100 09/17/18 1110  BP: 120/73 114/80 116/83 115/74  Pulse: 79     Resp: 16 17 19 16   Temp:  99.1 F (37.3 C)  99 F (37.2 C)  TempSrc:      SpO2: 96% 98% 100% 98%  Weight:      Height:        Start Time: 1030 hrs. End Time: 1040 hrs. Materials:  Needle(s) Type: Spinal Needle Gauge: 25G Length: 3.5-in Medication(s): Please see orders for medications and dosing details. 5 cc solution made of 4 cc of 0.2% ropivacaine, 1 cc of Decadron 10 mg/cc.  1.5 cc injected at each level above. Imaging Guidance (Non-Spinal):          Type of Imaging Technique: Fluoroscopy Guidance (Non-Spinal) Indication(s): Assistance in needle guidance and placement for procedures requiring needle placement in or near specific anatomical locations not easily accessible without such assistance. Exposure Time: Please see nurses notes. Contrast: None used. Fluoroscopic Guidance: I was personally present during the use of fluoroscopy. "Tunnel Vision Technique" used to obtain the best possible view of the target area. Parallax error corrected before commencing the procedure. "Direction-depth-direction" technique used to introduce the needle under continuous pulsed fluoroscopy. Once target was reached, antero-posterior, oblique, and lateral fluoroscopic projection used confirm needle placement in all planes. Images permanently stored in EMR. Interpretation: No contrast injected. I personally interpreted the imaging intraoperatively. Adequate needle placement confirmed in multiple planes.  Permanent images saved into the patient's record.  Antibiotic Prophylaxis:   Anti-infectives (From admission, onward)   None     Indication(s): None identified  Post-operative Assessment:  Post-procedure Vital Signs:  Pulse/HCG Rate: 7964 Temp: 99 F (37.2 C) Resp: 16 BP: 115/74 SpO2: 98 %  EBL: None  Complications: No immediate post-treatment complications observed by team, or reported by patient.  Note: The patient tolerated the entire procedure well. A repeat set of vitals were taken after the procedure and the patient was kept under observation following institutional policy, for this type of procedure. Post-procedural neurological assessment was performed, showing return to baseline, prior to discharge. The patient was provided with post-procedure discharge instructions, including a section on how to identify potential problems. Should any problems arise concerning this procedure, the patient was given instructions to immediately contact us, at any time, without hesitation. In any case, we plan to contact the patient by telephone for a follow-up status report regarding this interventional procedure.  Comments:  No additional relevant information.  Plan of Care   Imaging Orders     DG C-Arm 1-60 Min-No Report Procedure Orders    No procedure(s) ordered today    Medications ordered for procedure: Meds ordered this encounter  Medications  . lactated ringers infusion 1,000 mL  . fentaNYL (SUBLIMAZE) injection 25-100 mcg    Make sure Narcan is available in the pyxis when using this medication. In the event of respiratory depression (RR< 8/min): Titrate NARCAN (naloxone) in increments of 0.1 to 0.2 mg IV at 2-3 minute intervals, until desired degree of reversal.  . ropivacaine (PF) 2 mg/mL (0.2%) (NAROPIN) injection 10 mL  . lidocaine (XYLOCAINE) 2 % (with pres) injection 400 mg  . dexamethasone (DECADRON) injection 10 mg   Medications administered: We administered  lactated ringers, fentaNYL, ropivacaine (PF) 2 mg/mL (0.2%), lidocaine, and dexamethasone.  See the medical record for exact dosing, route, and time of administration.  Disposition: Discharge home  Discharge Date & Time: 09/17/2018; 1113 hrs.   Physician-requested Follow-up: Return in about 4 weeks (around 10/15/2018) for Post Procedure Evaluation.  Future Appointments  Date Time Provider Department Center  10/21/2018  8:45 AM Edward JollyLateef, Hadlyn Amero, MD [facility] None   Primary Care Physician: Duanne LimerickJones, Deanna C, MD [facility] Note by: Edward JollyBilal Nile Prisk, MD Date: 09/17/2018; Time: 12:58 PM  Disclaimer:  Medicine is not an exact science. The only guarantee in medicine is that nothing is guaranteed. It is important to note that the decision to proceed with this intervention was based on the information collected from the patient. The Data and conclusions were drawn from the patient's questionnaire, the interview, and the physical examination. Because the information was provided in large part by the patient, it cannot be guaranteed that it has not been purposely or unconsciously manipulated. Every effort has been made to obtain as much relevant data as possible for this evaluation. It is important to note that the conclusions that lead to this procedure are derived in large part from the available data. Always take into account that the treatment will also be dependent on availability of resources and existing treatment guidelines, considered by other Pain Management Practitioners as being common knowledge and practice, at the time of the intervention. For Medico-Legal purposes, it is also important to point out that variation in procedural techniques and pharmacological choices are the acceptable norm. The indications, contraindications, technique, and results of the above procedure should only be interpreted and judged by a Board-Certified Interventional Pain Specialist with  extensive familiarity and expertise in the same exact procedure and technique.

## 2018-09-18 ENCOUNTER — Telehealth: Payer: Self-pay | Admitting: *Deleted

## 2018-09-18 NOTE — Telephone Encounter (Signed)
No answer. LVM for patient to call for any concerns. 

## 2018-10-02 IMAGING — CT CT KNEE*L* W/O CM
3 series · 12 of 33 positions shown, 14 images · non-contrast
Comparison: Left knee x-rays from same day. Intraoperative x-rays
dated February 22, 2015 and May 04, 2014.

CLINICAL DATA: Left knee pain and swelling after feeling a pop this
morning. History of prior tibial plateau fracture.

EXAM:
CT OF THE LEFT KNEE WITHOUT CONTRAST
TECHNIQUE: Multidetector CT imaging of the left knee was performed according to
the standard protocol. Multiplanar CT image reconstructions were
also generated.

[Series 8: axial st · axial · 0.29mm/px · z∈[+752,+895]mm · 4 of 207 slices shown, 5 images]
[im 32/207  soft-tissue]
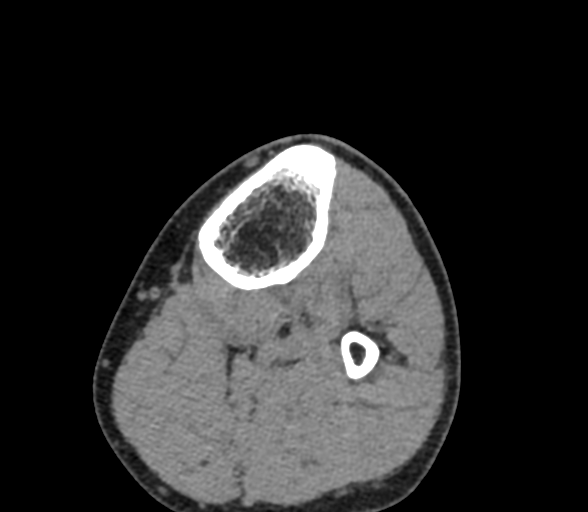
[im 32/207  bone]
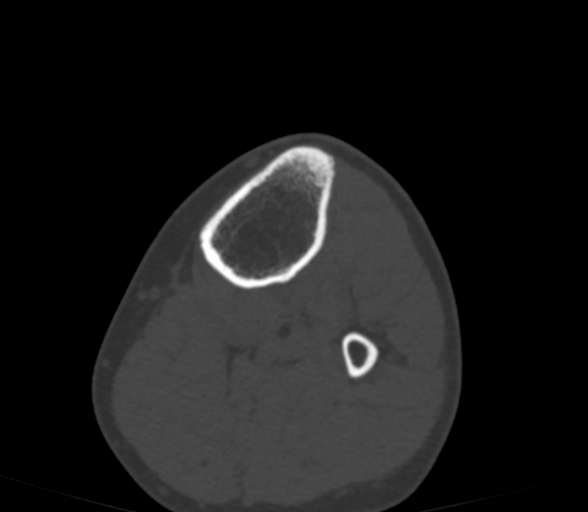
[im 80/207  bone]
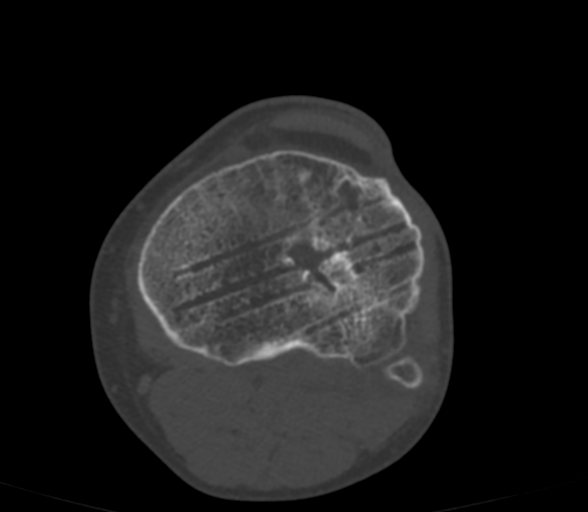
[im 127/207  bone]
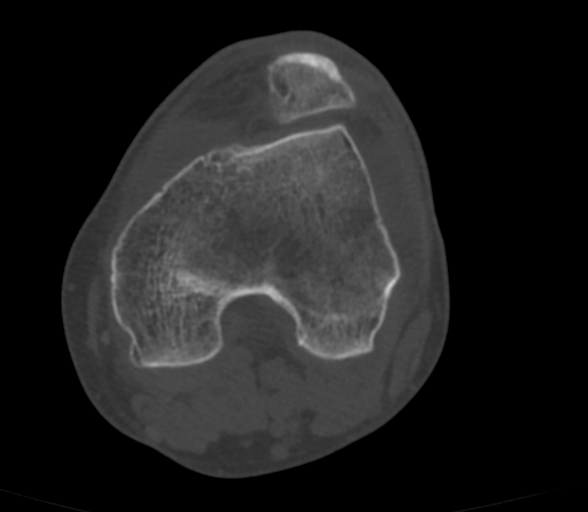
[im 175/207  bone]
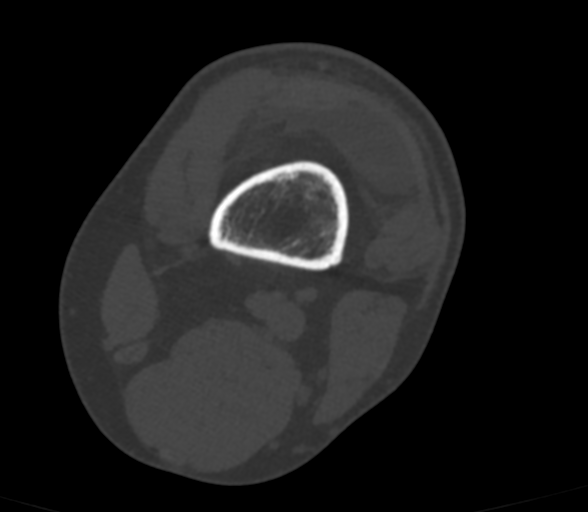

[Series 9: cor st · coronal · 0.33mm/px · 3 of 133 slices shown]
[im 27/133  bone]
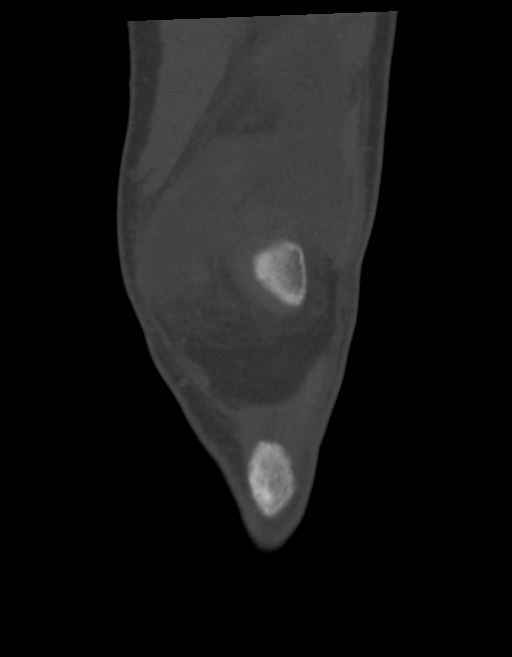
[im 53/133  bone]
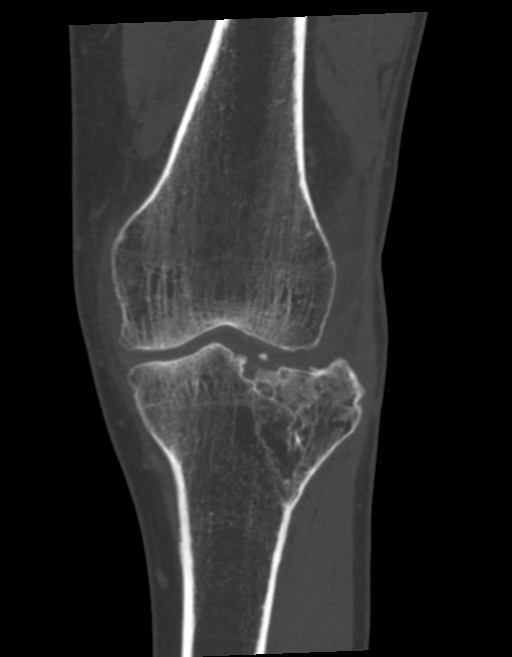
[im 80/133  bone]
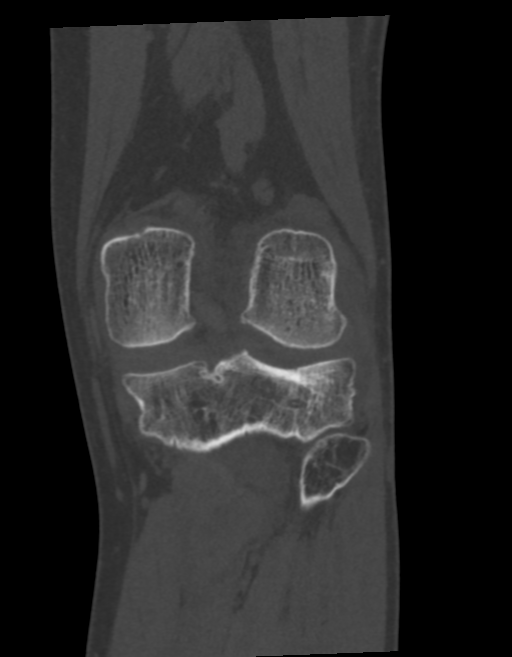

[Series 10: sag st · sagittal · 0.29mm/px · 5 of 118 slices shown, 6 images]
[im 40/118  bone]
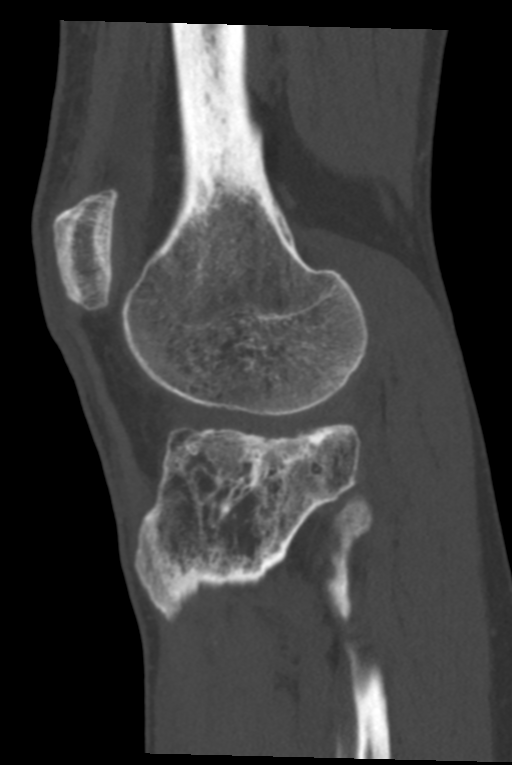
[im 49/118  bone]
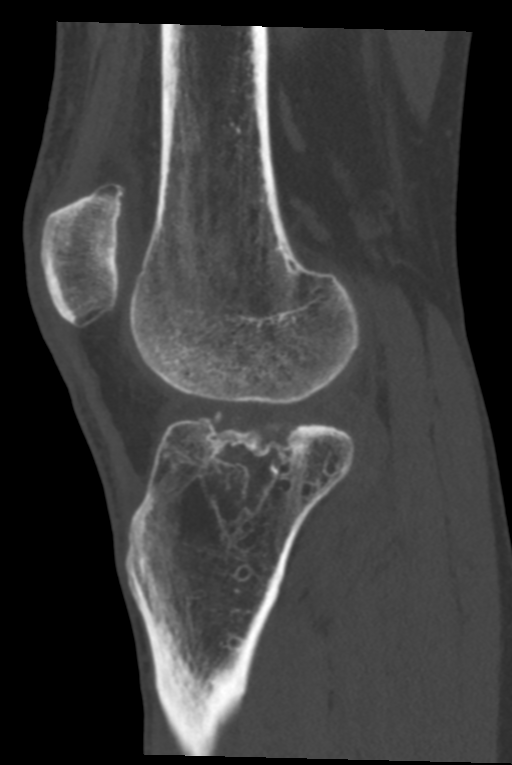
[im 59/118  soft-tissue]
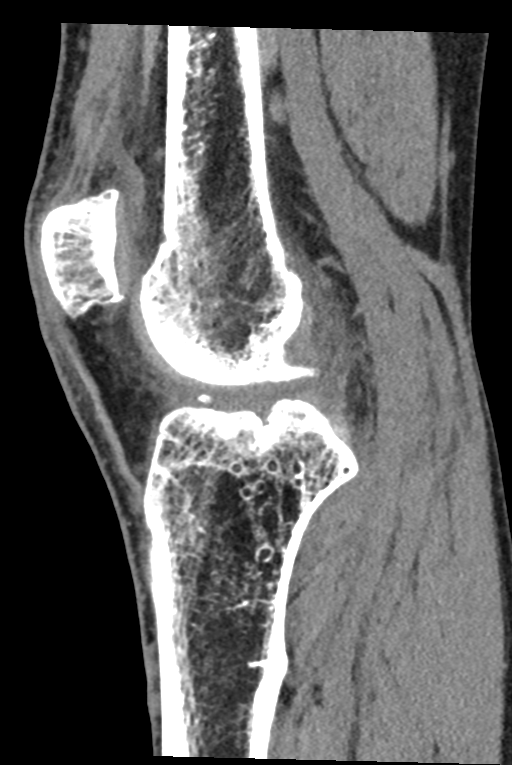
[im 59/118  bone]
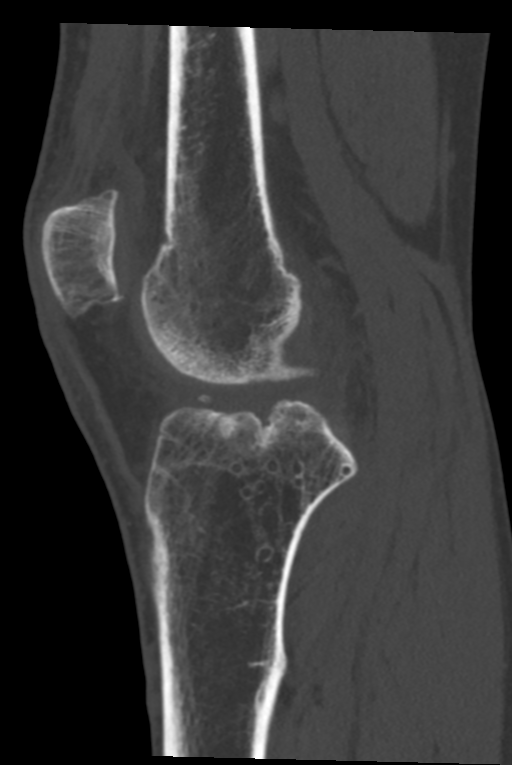
[im 69/118  bone]
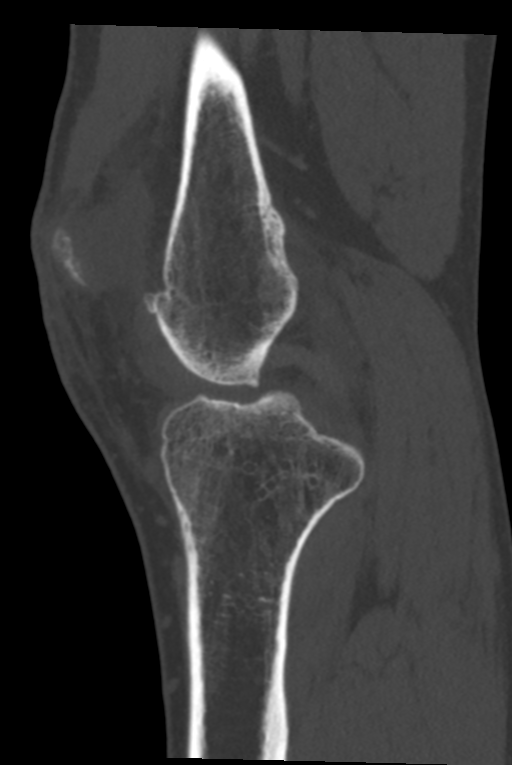
[im 79/118  bone]
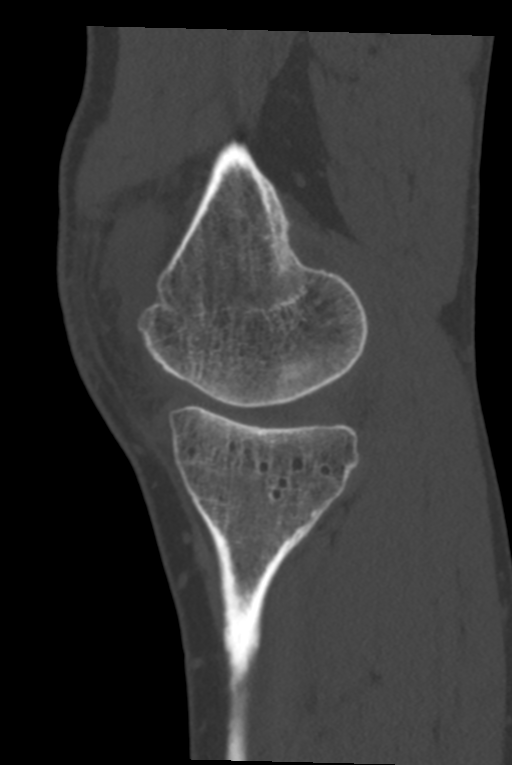

[12 of 33 positions shown; findings below may reference images not displayed]

FINDINGS: Bones/Joint/Cartilage

No acute fracture or dislocation. Mild lateral subluxation of the
patella. Moderate joint effusion. Chronic mildly depressed lateral
tibial plateau fracture with ghost tracks related to prior hardware.
Joint spaces are preserved. Tiny marginal tricompartmental
osteophytes.

Ligaments

Suboptimally assessed by CT.

Muscles and Tendons

The extensor mechanism is intact.  No muscle atrophy.

Soft tissues

No soft tissue mass or fluid collection.  No hematoma.
IMPRESSION: 1.  No acute osseous abnormality.  Moderate joint effusion.
2. Old mildly depressed lateral tibial plateau fracture.

## 2018-10-21 ENCOUNTER — Ambulatory Visit: Payer: Medicaid Other | Admitting: Student in an Organized Health Care Education/Training Program

## 2018-11-04 ENCOUNTER — Ambulatory Visit: Payer: Medicaid Other | Admitting: Student in an Organized Health Care Education/Training Program

## 2018-11-27 ENCOUNTER — Ambulatory Visit: Payer: Medicaid Other | Admitting: Student in an Organized Health Care Education/Training Program

## 2018-12-10 NOTE — Progress Notes (Signed)
Pt was called twice today, first time he was in the shower and stated that he would call back, second times left message on answering service.

## 2018-12-11 ENCOUNTER — Ambulatory Visit: Payer: Medicaid Other | Admitting: Student in an Organized Health Care Education/Training Program

## 2018-12-11 ENCOUNTER — Other Ambulatory Visit: Payer: Self-pay

## 2018-12-15 ENCOUNTER — Encounter: Payer: Self-pay | Admitting: Student in an Organized Health Care Education/Training Program

## 2018-12-16 ENCOUNTER — Other Ambulatory Visit: Payer: Self-pay

## 2018-12-16 ENCOUNTER — Ambulatory Visit
Payer: Medicaid Other | Attending: Student in an Organized Health Care Education/Training Program | Admitting: Student in an Organized Health Care Education/Training Program

## 2018-12-16 DIAGNOSIS — S82142S Displaced bicondylar fracture of left tibia, sequela: Secondary | ICD-10-CM

## 2018-12-16 DIAGNOSIS — M25362 Other instability, left knee: Secondary | ICD-10-CM

## 2018-12-16 DIAGNOSIS — M25562 Pain in left knee: Secondary | ICD-10-CM

## 2018-12-16 DIAGNOSIS — G8929 Other chronic pain: Secondary | ICD-10-CM

## 2018-12-16 NOTE — Progress Notes (Signed)
Pain Management Virtual Encounter Note - Virtual Visit via Telephone Telehealth (real-time audio visits between healthcare provider and patient).  Patient's Phone No. & Preferred Pharmacy:  [phone] (home); [phone] (mobile); (Preferred) [phone] No e-mail address on record  [facility] #[id number] [place], [place] - [address] AT [facility] [address] [place] [address] Phone: [phone] Fax: [phone]   Pre-screening note:  Our staff contacted Mr. Whittenburg and offered him an "in person", "face-to-face" appointment versus a telephone encounter. He indicated preferring the telephone encounter, at this time.  Reason for Virtual Visit: COVID-19*  Social distancing based on CDC and AMA recommendations.   I contacted Andre Mclaughlin on 12/16/2018 at 1:08 PM via telephone and clearly identified myself as Edward Jolly, MD. I verified that I was speaking with the correct person using two identifiers (Name and date of birth: April 17, 1976).  Advanced Informed Consent I sought verbal advanced consent from Andre Mclaughlin for virtual visit interactions. I informed Mr. Hurta of possible security and privacy concerns, risks, and limitations associated with providing "not-in-person" medical evaluation and management services. I also informed Mr. Mader of the availability of "in-person" appointments. Finally, I informed him that there would be a charge for the virtual visit and that he could be  personally, fully or partially, financially responsible for it. Mr. Peruski expressed understanding and agreed to proceed.   Historic Elements   Mr. TRELON PLUSH is a 43 y.o. year old, male patient evaluated today after his last encounter by our practice on 09/18/2018. Mr. Perleberg  has a past medical history of Chronic kidney disease, GERD (gastroesophageal reflux disease), and Tobacco abuse. He also  has a past surgical history that includes Appendectomy; ORIF tibia plateau (Left);  and Hardware Removal (Left, 02/22/2015). Mr. Minshall has a current medication list which includes the following prescription(s): famotidine, naproxen, naproxen sodium, ondansetron, and oxycodone-acetaminophen. He  reports that he has been smoking cigarettes. He has a 30.00 pack-year smoking history. He has never used smokeless tobacco. He reports that he does not drink alcohol or use drugs. Mr. Mullendore has No Known Allergies.   HPI  I last communicated with him on 09/17/2018. Today, he is being contacted for a post-procedure assessment.  Post-Procedure Evaluation  Procedure: Left knee genicular nerve block Pre-procedure pain level:  6/10 Post-procedure: 0/10          Initial Analgesic Effects (1st post-procedure hour): 100 % Sedation: Please see nurses note. When none is used, analgesia during this period is strictly due to the local anesthetic. When sedation is administered, analgesia may be a combination of the IV analgesic/anxiolytic, plus the effect of the local anesthetics used.  Persistent Analgesic Response (subsequent 4-6 hours post-procedure): 80 % Local anesthetic used: Long-acting (4-6 hours) Analgesic effects during this period is associated to the localized infiltration of local anesthetics and therefore caries significant diagnostic value as to the etiological location, or anatomical origin, of the pain. Expected duration of relief is directly dependent on the pharmacodynamics of the local anesthetic used.  Analgesic Response past initial 6 hours post-procedure: 50 % (for 2 days post procedure and then pain back to pre-block levels) Long-term benefit: Defined as any relief past the pharmacologic duration of the local anesthetics.   Current benefits: Defined as benefit that persist at this time.   Analgesia: No significant improvement Function: No significant improvement ROM: No significant improvement  Review of recent tests  DG C-Arm 1-60 Min-No Report Fluoroscopy was utilized by  the  requesting physician.  No radiographic  interpretation.    Office Visit on 04/09/2017  Component Date Value Ref Range Status  . TSH 04/18/2017 0.928  0.450 - 4.500 uIU/mL Final  . T4, Total 04/18/2017 8.9  4.5 - 12.0 ug/dL Final  . T3 Uptake Ratio 04/18/2017 28  24 - 39 % Final  . Free Thyroxine Index 04/18/2017 2.5  1.2 - 4.9 Final  . WBC 04/18/2017 11.0* 3.4 - 10.8 x10E3/uL Final  . RBC 04/18/2017 5.76  4.14 - 5.80 x10E6/uL Final  . Hemoglobin 04/18/2017 17.2  13.0 - 17.7 g/dL Final  . Hematocrit 37/16/9678 51.1* 37.5 - 51.0 % Final  . MCV 04/18/2017 89  79 - 97 fL Final  . MCH 04/18/2017 29.9  26.6 - 33.0 pg Final  . MCHC 04/18/2017 33.7  31.5 - 35.7 g/dL Final  . RDW 93/81/0175 13.9  12.3 - 15.4 % Final  . Platelets 04/18/2017 361  150 - 379 x10E3/uL Final  . Neutrophils 04/18/2017 60  Not Estab. % Final  . Lymphs 04/18/2017 26  Not Estab. % Final  . Monocytes 04/18/2017 9  Not Estab. % Final  . Eos 04/18/2017 5  Not Estab. % Final  . Basos 04/18/2017 0  Not Estab. % Final  . Neutrophils Absolute 04/18/2017 6.6  1.4 - 7.0 x10E3/uL Final  . Lymphocytes Absolute 04/18/2017 2.8  0.7 - 3.1 x10E3/uL Final  . Monocytes Absolute 04/18/2017 1.0* 0.1 - 0.9 x10E3/uL Final  . EOS (ABSOLUTE) 04/18/2017 0.5* 0.0 - 0.4 x10E3/uL Final  . Basophils Absolute 04/18/2017 0.0  0.0 - 0.2 x10E3/uL Final  . Immature Granulocytes 04/18/2017 0  Not Estab. % Final  . Immature Grans (Abs) 04/18/2017 0.0  0.0 - 0.1 x10E3/uL Final   Assessment  The primary encounter diagnosis was Chronic pain of left knee. Diagnoses of Closed fracture of left tibial plateau, sequela and Anterolateral rotary instability of left knee were also pertinent to this visit.  Plan of Care  I am having Stacy L. Okereke maintain his naproxen sodium, famotidine, ondansetron, naproxen, and oxyCODONE-acetaminophen.   43 year old male with left knee pain related to closed fracture of left tibial plateau with removal of previous  hardware of the lateral proximal tibia who presents for virtual follow-up status post left knee genicular nerve block.  Unfortunately, the block was not effective for the patient's left knee pain.  He endorses approximately 50% pain relief for the first 2 days with return of his pain back to pre-block levels thereafter.  Today he is endorsing worsening pain and states he sustained a fall approximately 4 days ago going down the stairs.  This was due to his left knee instability.  Patient has tried and failed intra-articular steroid injections.  At this time, I do not recommend the patient proceed with left knee genicular radiofrequency ablation as the patient did not have a positive diagnostic genicular nerve block which is defined as at least 50% pain relief for at least 5 days post block.  I recommend the patient follow back up with orthopedics to discuss surgical management as it was noted that the patient may need a total knee arthroplasty if conservative measures have failed.   I discussed the assessment and treatment plan with the patient. The patient was provided an opportunity to ask questions and all were answered. The patient agreed with the plan and demonstrated an understanding of the instructions.  Total duration of non-face-to-face encounter: 15 minutes.  Note by: Edward Jolly, MD Date: 12/16/2018; Time: 1:08  PM  Disclaimer:  * Given the special circumstances of the COVID-19 pandemic, the federal government has announced that the Office for Civil Rights (OCR) will exercise its enforcement discretion and will not impose penalties on physicians using telehealth in the event of noncompliance with regulatory requirements under the [employer] Insurance Portability and Accountability Act (HIPAA) in connection with the good faith provision of telehealth during the COVID-19 national public health emergency. (AMA)

## 2019-09-08 ENCOUNTER — Ambulatory Visit: Payer: Self-pay | Admitting: Family Medicine

## 2020-03-25 ENCOUNTER — Ambulatory Visit: Payer: Medicaid Other | Admitting: Family Medicine

## 2020-04-27 ENCOUNTER — Ambulatory Visit: Payer: Medicaid Other | Admitting: Family Medicine

## 2020-06-23 ENCOUNTER — Encounter: Payer: Self-pay | Admitting: Family Medicine

## 2020-06-23 ENCOUNTER — Ambulatory Visit: Payer: Medicaid Other | Admitting: Family Medicine

## 2020-06-23 ENCOUNTER — Other Ambulatory Visit: Payer: Self-pay

## 2020-06-23 VITALS — BP 136/90 | HR 91 | Temp 99.1°F | Resp 16 | Ht 72.0 in | Wt 201.6 lb

## 2020-06-23 DIAGNOSIS — M25562 Pain in left knee: Secondary | ICD-10-CM | POA: Diagnosis not present

## 2020-06-23 DIAGNOSIS — Z7689 Persons encountering health services in other specified circumstances: Secondary | ICD-10-CM | POA: Diagnosis not present

## 2020-06-23 DIAGNOSIS — G8929 Other chronic pain: Secondary | ICD-10-CM

## 2020-06-23 DIAGNOSIS — Z8781 Personal history of (healed) traumatic fracture: Secondary | ICD-10-CM

## 2020-06-23 DIAGNOSIS — M1732 Unilateral post-traumatic osteoarthritis, left knee: Secondary | ICD-10-CM

## 2020-06-23 NOTE — Progress Notes (Signed)
Subjective:    Patient ID: Andre Mclaughlin, male    DOB: July 22, 1976, 44 y.o.   MRN: [id number]  Andre Mclaughlin is a 44 y.o. male presenting on 06/23/2020 for Establish Care (knee pain --referral)   HPI   Left Knee, chronic pain / Post Traumatic Arthritis History 10 years ago, December - fracture Left knee. He has done cortisone injections (lasting only about 2 weeks only but temporary relief), medications, nerve blocks, he has seen various specialists including [facility] and [facility] - no further treatments available. - He now has significantly disability from this Left knee problem, difficulty standing up - often he has to hold his knee to avoid issues with instability as he stands up.  - Takes high dose Aleve PRN  Dr Rosita Kea previously at [facility] - was advised he has a severe fracture and 13 screws, was told may require future knee replacement surgery in 10 years, which is now approximately time.  He is interested in knee replacement.  Admits increased effusion of Left knee.  Limited mobility currently due to knee pain and reduced function  Denies redness or swelling, or new injury, or other joint pain and swelling   Depression screen [facility] 2/9 06/23/2020 09/17/2018 09/16/2018  Decreased Interest 0 0 0  Down, Depressed, Hopeless 0 0 0  PHQ - 2 Score 0 0 0    Past Medical History:  Diagnosis Date  . Chronic kidney disease    KIDNEY STONES  . GERD (gastroesophageal reflux disease)   . Tobacco abuse    Past Surgical History:  Procedure Laterality Date  . APPENDECTOMY    . HARDWARE REMOVAL Left 02/22/2015   Procedure: HARDWARE REMOVAL;  Surgeon: Kennedy Bucker, MD;  Location: [facility];  Service: Orthopedics;  Laterality: Left;  . ORIF TIBIA PLATEAU Left    Social History   Socioeconomic History  . Marital status: Single    Spouse name: Not on file  . Number of children: 2  . Years of education: 10th grade  . Highest education level: Not on file   Occupational History  . Not on file  Tobacco Use  . Smoking status: Current Every Day Smoker    Packs/day: 1.50    Years: 10.00    Pack years: 15.00    Types: Cigarettes  . Smokeless tobacco: Never Used  Vaping Use  . Vaping Use: Never used  Substance and Sexual Activity  . Alcohol use: No    Alcohol/week: 0.0 standard drinks    Comment: occasional  . Drug use: No  . Sexual activity: Not Currently  Other Topics Concern  . Not on file  Social History Narrative  . Not on file   Social Determinants of Health   Financial Resource Strain:   . Difficulty of Paying Living Expenses: Not on file  Food Insecurity:   . Worried About Programme researcher, broadcasting/film/video in the Last Year: Not on file  . Ran Out of Food in the Last Year: Not on file  Transportation Needs:   . Lack of Transportation (Medical): Not on file  . Lack of Transportation (Non-Medical): Not on file  Physical Activity:   . Days of Exercise per Week: Not on file  . Minutes of Exercise per Session: Not on file  Stress:   . Feeling of Stress : Not on file  Social Connections:   . Frequency of Communication with Friends and Family: Not on file  . Frequency of Social Gatherings with Friends and  Family: Not on file  . Attends Religious Services: Not on file  . Active Member of Clubs or Organizations: Not on file  . Attends Banker Meetings: Not on file  . Marital Status: Not on file  Intimate Partner Violence:   . Fear of Current or Ex-Partner: Not on file  . Emotionally Abused: Not on file  . Physically Abused: Not on file  . Sexually Abused: Not on file   History reviewed. No pertinent family history. Current Outpatient Medications on File Prior to Visit  Medication Sig  . famotidine (PEPCID) 10 MG tablet Take 10 mg by mouth as needed for heartburn or indigestion.  . ondansetron (ZOFRAN ODT) 4 MG disintegrating tablet Take 1 tablet (4 mg total) by mouth every 8 (eight) hours as needed for nausea or vomiting.    . naproxen sodium (RA NAPROXEN SODIUM) 220 MG tablet Take 2 tablets by mouth every 6 (six) hours as needed. (Patient not taking: Reported on 06/23/2020)   No current facility-administered medications on file prior to visit.    Review of Systems Per HPI unless specifically indicated above      Objective:    BP 136/90   Pulse 91   Temp 99.1 F (37.3 C) (Temporal)   Resp 16   Ht 6' (1.829 m)   Wt 201 lb 9.6 oz (91.4 kg)   SpO2 99%   BMI 27.34 kg/m   Wt Readings from Last 3 Encounters:  06/23/20 201 lb 9.6 oz (91.4 kg)  09/17/18 168 lb (76.2 kg)  09/16/18 166 lb 6.4 oz (75.5 kg)    Physical Exam Vitals and nursing note reviewed.  Constitutional:      General: He is not in acute distress.    Appearance: He is well-developed. He is not diaphoretic.     Comments: Well-appearing, comfortable, cooperative  HENT:     Head: Normocephalic and atraumatic.  Eyes:     General:        Right eye: No discharge.        Left eye: No discharge.     Conjunctiva/sclera: Conjunctivae normal.  Cardiovascular:     Rate and Rhythm: Normal rate.  Pulmonary:     Effort: Pulmonary effort is normal.  Musculoskeletal:     Comments: Left knee with bulkiness deformity and some localized effusion. No erythema. Tender to palpation on exam, has crepitus and abnormal range of motion is reduced.  Skin:    General: Skin is warm and dry.     Findings: No erythema or rash.  Neurological:     Mental Status: He is alert and oriented to person, place, and time.  Psychiatric:        Behavior: Behavior normal.     Comments: Well groomed, good eye contact, normal speech and thoughts      I have personally reviewed the radiology report from 03/2018.   CLINICAL DATA:  Left knee pain and swelling after feeling a pop this morning. History of prior tibial plateau fracture.  EXAM: CT OF THE LEFT KNEE WITHOUT CONTRAST  TECHNIQUE: Multidetector CT imaging of the left knee was performed according  to the standard protocol. Multiplanar CT image reconstructions were also generated.  COMPARISON:  Left knee x-rays from same day. Intraoperative x-rays dated February 22, 2015 and May 04, 2014.  FINDINGS: Bones/Joint/Cartilage  No acute fracture or dislocation. Mild lateral subluxation of the patella. Moderate joint effusion. Chronic mildly depressed lateral tibial plateau fracture with ghost tracks related to prior hardware.  Joint spaces are preserved. Tiny marginal tricompartmental osteophytes.  Ligaments  Suboptimally assessed by CT.  Muscles and Tendons  The extensor mechanism is intact.  No muscle atrophy.  Soft tissues  No soft tissue mass or fluid collection.  No hematoma.  IMPRESSION: 1.  No acute osseous abnormality.  Moderate joint effusion. 2. Old mildly depressed lateral tibial plateau fracture.   Electronically Signed   By: Obie DredgeWilliam T Derry M.D.   On: 04/01/2018 12:32  -------------------------------------------------------------------------    CLINICAL DATA:  Left knee pain, swelling  EXAM: LEFT KNEE - COMPLETE 4+ VIEW  COMPARISON:  05/01/2014.  Intraoperative images 712 2016  FINDINGS: Removal of prior hardware in the lateral proximal tibia. There is mild depression of the lateral tibial plateau, likely related to old healed fracture. No visible acute fracture. No visible joint effusion.  IMPRESSION: Mild depression of the lateral tibial plateau, likely related to old fracture. No acute fracture noted.   Electronically Signed   By: Charlett NoseKevin  Dover M.D.   On: 04/01/2018 11:15   Results for orders placed or performed in visit on 04/09/17  Thyroid Panel With TSH  Result Value Ref Range   TSH 0.928 0.450 - 4.500 uIU/mL   T4, Total 8.9 4.5 - 12.0 ug/dL   T3 Uptake Ratio 28 24 - 39 %   Free Thyroxine Index 2.5 1.2 - 4.9  CBC with Differential/Platelet  Result Value Ref Range   WBC 11.0 (H) 3.4 - 10.8 x10E3/uL   RBC  5.76 4.14 - 5.80 x10E6/uL   Hemoglobin 17.2 13.0 - 17.7 g/dL   Hematocrit 16.151.1 (H) 09.637.5 - 51.0 %   MCV 89 79 - 97 fL   MCH 29.9 26.6 - 33.0 pg   MCHC 33.7 31 - 35 g/dL   RDW 04.513.9 40.912.3 - 81.115.4 %   Platelets 361 150 - 379 x10E3/uL   Neutrophils 60 Not Estab. %   Lymphs 26 Not Estab. %   Monocytes 9 Not Estab. %   Eos 5 Not Estab. %   Basos 0 Not Estab. %   Neutrophils Absolute 6.6 1.40 - 7.00 x10E3/uL   Lymphocytes Absolute 2.8 0 - 3 x10E3/uL   Monocytes Absolute 1.0 (H) 0 - 0 x10E3/uL   EOS (ABSOLUTE) 0.5 (H) 0.0 - 0.4 x10E3/uL   Basophils Absolute 0.0 0 - 0 x10E3/uL   Immature Granulocytes 0 Not Estab. %   Immature Grans (Abs) 0.0 0.0 - 0.1 x10E3/uL      Assessment & Plan:   Problem List Items Addressed This Visit    Chronic pain of left knee   Relevant Orders   Ambulatory referral to Orthopedic Surgery    Other Visit Diagnoses    Post-traumatic osteoarthritis of left knee    -  Primary   Relevant Orders   Ambulatory referral to Orthopedic Surgery   History of tibial fracture       Relevant Orders   Ambulatory referral to Orthopedic Surgery   Encounter to establish care with new doctor           referral to [facility] for evaluation of chronic severe worsening Left knee pain and dysfunction following prior traumatic left knee fracture (tib, fib, hardware severe fracture), he was advised at that time may need knee replacement in 10 years, now it has been 10 years and he has tried and failed all conservative therapy medications, cortisone injections, nerve blocks, and now interested in new opinion of orthopedic to consider Left Knee replacement surgery  No orders of the defined types were placed in this encounter.  Orders Placed This Encounter  Procedures  . Ambulatory referral to Orthopedic Surgery    Referral Priority:   Routine    Referral Type:   Surgical    Referral Reason:   Specialty Services Required    Requested Specialty:   Orthopedic Surgery     Number of Visits Requested:   1     Follow up plan: Return in about 3 months (around 09/23/2020) for follow-up after orthopedic.  Saralyn Pilar, DO [facility] 06/23/2020, 10:53 AM

## 2020-06-23 NOTE — Patient Instructions (Addendum)
Thank you for coming to the office today.  Recommend to start taking Tylenol Extra Strength 500mg  tabs - take 1 to 2 tabs per dose (max 1000mg ) every 6-8 hours for pain (take regularly, don't skip a dose for next 7 days), max 24 hour daily dose is 6 tablets or 3000mg . In the future you can repeat the same everyday Tylenol course for 1-2 weeks at a time.   Recommend trial of Anti-inflammatory with Naproxen / Aleve 250 x 2 tabs = 500mg  tabs - take them with food and plenty of water TWICE daily every day (breakfast and dinner), for next 2 to 4 weeks, then you may take only as needed - DO NOT TAKE any ibuprofen, aleve, motrin while you are taking this medicine - It is safe to take Tylenol Ext Str 500mg  tabs - take 1 to 2 (max dose 1000mg ) every 6 hours as needed for breakthrough pain, max 24 hour daily dose is 6 to 8 tablets or 4000mg   [facility] ([facility] Address: [address] [place],  Hours:  9AM-5PM Phone: [phone]    Please schedule a Follow-up Appointment to: Return in about 3 months (around 09/23/2020) for follow-up after orthopedic.  If you have any other questions or concerns, please feel free to call the office or send a message through MyChart. You may also schedule an earlier appointment if necessary.  Additionally, you may be receiving a survey about your experience at our office within a few days to 1 week by e-mail or mail. We value your feedback.  [facility], DO [facility], [place]

## 2020-07-04 DIAGNOSIS — M13862 Other specified arthritis, left knee: Secondary | ICD-10-CM | POA: Diagnosis not present

## 2023-05-06 ENCOUNTER — Telehealth: Payer: Self-pay

## 2023-05-06 NOTE — Telephone Encounter (Signed)
Medicaid Managed Care   Unsuccessful Outreach Note  05/06/2023 Name: Andre Mclaughlin MRN: [id number] DOB: 04/15/1976  Referred by: Smitty Cords, DO Reason for referral : No chief complaint on file.   An unsuccessful telephone outreach was attempted today. The patient was referred to the case management team for assistance with care management and care coordination.   Follow Up Plan: If patient returns call to provider office, please advise to call Embedded Care Management Care Guide Nicholes Rough* at [phone]*  Nicholes Rough, CMA Care Guide [employer]

## 2023-10-30 DIAGNOSIS — C189 Malignant neoplasm of colon, unspecified: Secondary | ICD-10-CM | POA: Diagnosis not present

## 2023-10-30 DIAGNOSIS — N200 Calculus of kidney: Secondary | ICD-10-CM | POA: Diagnosis not present

## 2023-10-30 DIAGNOSIS — R197 Diarrhea, unspecified: Secondary | ICD-10-CM | POA: Diagnosis not present

## 2023-10-30 DIAGNOSIS — Z9189 Other specified personal risk factors, not elsewhere classified: Secondary | ICD-10-CM | POA: Diagnosis not present

## 2023-10-30 DIAGNOSIS — R1084 Generalized abdominal pain: Secondary | ICD-10-CM | POA: Diagnosis not present

## 2023-10-31 DIAGNOSIS — N200 Calculus of kidney: Secondary | ICD-10-CM | POA: Diagnosis not present

## 2023-11-11 DIAGNOSIS — K639 Disease of intestine, unspecified: Secondary | ICD-10-CM | POA: Diagnosis not present

## 2023-11-11 DIAGNOSIS — R109 Unspecified abdominal pain: Secondary | ICD-10-CM | POA: Diagnosis not present

## 2023-11-11 DIAGNOSIS — Z9189 Other specified personal risk factors, not elsewhere classified: Secondary | ICD-10-CM | POA: Diagnosis not present

## 2023-11-19 DIAGNOSIS — D490 Neoplasm of unspecified behavior of digestive system: Secondary | ICD-10-CM | POA: Diagnosis not present

## 2023-11-19 DIAGNOSIS — K621 Rectal polyp: Secondary | ICD-10-CM | POA: Diagnosis not present

## 2023-11-19 DIAGNOSIS — R933 Abnormal findings on diagnostic imaging of other parts of digestive tract: Secondary | ICD-10-CM | POA: Diagnosis not present

## 2023-11-19 DIAGNOSIS — K529 Noninfective gastroenteritis and colitis, unspecified: Secondary | ICD-10-CM | POA: Diagnosis not present

## 2023-11-19 DIAGNOSIS — K635 Polyp of colon: Secondary | ICD-10-CM | POA: Diagnosis not present

## 2023-11-19 DIAGNOSIS — Z8379 Family history of other diseases of the digestive system: Secondary | ICD-10-CM | POA: Diagnosis not present

## 2023-11-19 DIAGNOSIS — Z800 Family history of malignant neoplasm of digestive organs: Secondary | ICD-10-CM | POA: Diagnosis not present

## 2023-11-19 DIAGNOSIS — D125 Benign neoplasm of sigmoid colon: Secondary | ICD-10-CM | POA: Diagnosis not present

## 2023-11-19 DIAGNOSIS — D128 Benign neoplasm of rectum: Secondary | ICD-10-CM | POA: Diagnosis not present

## 2023-11-19 DIAGNOSIS — F17200 Nicotine dependence, unspecified, uncomplicated: Secondary | ICD-10-CM | POA: Diagnosis not present

## 2023-11-19 DIAGNOSIS — K639 Disease of intestine, unspecified: Secondary | ICD-10-CM | POA: Diagnosis not present

## 2023-11-26 DIAGNOSIS — K639 Disease of intestine, unspecified: Secondary | ICD-10-CM | POA: Diagnosis not present

## 2023-11-26 DIAGNOSIS — R911 Solitary pulmonary nodule: Secondary | ICD-10-CM | POA: Diagnosis not present

## 2023-11-26 DIAGNOSIS — R918 Other nonspecific abnormal finding of lung field: Secondary | ICD-10-CM | POA: Diagnosis not present

## 2023-11-28 DIAGNOSIS — K5010 Crohn's disease of large intestine without complications: Secondary | ICD-10-CM | POA: Diagnosis not present

## 2023-11-28 DIAGNOSIS — Z87891 Personal history of nicotine dependence: Secondary | ICD-10-CM | POA: Diagnosis not present

## 2023-11-28 DIAGNOSIS — D126 Benign neoplasm of colon, unspecified: Secondary | ICD-10-CM | POA: Diagnosis not present

## 2023-11-28 DIAGNOSIS — K50111 Crohn's disease of large intestine with rectal bleeding: Secondary | ICD-10-CM | POA: Diagnosis not present

## 2023-11-28 DIAGNOSIS — Z79899 Other long term (current) drug therapy: Secondary | ICD-10-CM | POA: Diagnosis not present

## 2023-11-28 DIAGNOSIS — K639 Disease of intestine, unspecified: Secondary | ICD-10-CM | POA: Diagnosis not present

## 2023-11-28 DIAGNOSIS — Z7185 Encounter for immunization safety counseling: Secondary | ICD-10-CM | POA: Diagnosis not present

## 2023-11-28 DIAGNOSIS — K9089 Other intestinal malabsorption: Secondary | ICD-10-CM | POA: Diagnosis not present

## 2023-11-28 DIAGNOSIS — Z716 Tobacco abuse counseling: Secondary | ICD-10-CM | POA: Diagnosis not present

## 2023-12-18 DIAGNOSIS — K50111 Crohn's disease of large intestine with rectal bleeding: Secondary | ICD-10-CM | POA: Diagnosis not present

## 2023-12-18 DIAGNOSIS — E611 Iron deficiency: Secondary | ICD-10-CM | POA: Diagnosis not present

## 2023-12-25 DIAGNOSIS — K50111 Crohn's disease of large intestine with rectal bleeding: Secondary | ICD-10-CM | POA: Diagnosis not present

## 2023-12-25 DIAGNOSIS — E611 Iron deficiency: Secondary | ICD-10-CM | POA: Diagnosis not present

## 2024-02-06 DIAGNOSIS — Z5181 Encounter for therapeutic drug level monitoring: Secondary | ICD-10-CM | POA: Diagnosis not present

## 2024-02-06 DIAGNOSIS — K50111 Crohn's disease of large intestine with rectal bleeding: Secondary | ICD-10-CM | POA: Diagnosis not present

## 2024-03-16 ENCOUNTER — Ambulatory Visit: Payer: Medicaid Other | Admitting: Internal Medicine

## 2024-03-16 NOTE — Progress Notes (Deleted)
 Subjective:    Patient ID: Andre Mclaughlin, male    DOB: [birth year], 48 y.o.   MRN: [id number]  HPI  Patient presents to clinic today to establish care and for management of the conditions listed below.  Chronic left knee pain: Managed with naproxen  as needed.  CT left knee from 03/2018 reviewed.  He does not follow with orthopedics.   Review of Systems   Past Medical History:  Diagnosis Date   Chronic kidney disease    KIDNEY STONES   GERD (gastroesophageal reflux disease)    Tobacco abuse     Current Outpatient Medications  Medication Sig Dispense Refill   famotidine (PEPCID) 10 MG tablet Take 10 mg by mouth as needed for heartburn or indigestion.     naproxen  sodium (RA NAPROXEN  SODIUM) 220 MG tablet Take 2 tablets by mouth every 6 (six) hours as needed. (Patient not taking: Reported on 06/23/2020)     ondansetron  (ZOFRAN  ODT) 4 MG disintegrating tablet Take 1 tablet (4 mg total) by mouth every 8 (eight) hours as needed for nausea or vomiting. 12 tablet 0   No current facility-administered medications for this visit.    No Known Allergies  No family history on file.  Social History   Socioeconomic History   Marital status: Single    Spouse name: Not on file   Number of children: 2   Years of education: 10th grade   Highest education level: Not on file  Occupational History   Not on file  Tobacco Use   Smoking status: Every Day    Current packs/day: 1.50    Average packs/day: 1.5 packs/day for 10.0 years (15.0 ttl pk-yrs)    Types: Cigarettes   Smokeless tobacco: Never  Vaping Use   Vaping status: Never Used  Substance and Sexual Activity   Alcohol use: No    Alcohol/week: 0.0 standard drinks of alcohol    Comment: occasional   Drug use: No   Sexual activity: Not Currently  Other Topics Concern   Not on file  Social History Narrative   Not on file   Social Drivers of Health   Financial Resource Strain: Not on file  Food Insecurity: No Food  Insecurity (11/11/2023)   Received from [facility]   Hunger Vital Sign    Within the past 12 months, you worried that your food would run out before you got the money to buy more.: Never true    Within the past 12 months, the food you bought just didn't last and you didn't have money to get more.: Never true  Transportation Needs: No Transportation Needs (11/11/2023)   Received from [facility]   PRAPARE - Transportation    Lack of Transportation (Medical): No    Lack of Transportation (Non-Medical): No  Physical Activity: Not on file  Stress: Not on file  Social Connections: Not on file  Intimate Partner Violence: Not At Risk (11/19/2023)   Received from [facility]   Humiliation, Afraid, Rape, and Kick questionnaire    Within the last year, have you been afraid of your partner or ex-partner?: No    Within the last year, have you been humiliated or emotionally abused in other ways by your partner or ex-partner?: No    Within the last year, have you been kicked, hit, slapped, or otherwise physically hurt by your partner or ex-partner?: No    Within the last year, have you been raped or forced to have any kind  of sexual activity by your partner or ex-partner?: No     Constitutional: Denies fever, malaise, fatigue, headache or abrupt weight changes.  HEENT: Denies eye pain, eye redness, ear pain, ringing in the ears, wax buildup, runny nose, nasal congestion, bloody nose, or sore throat. Respiratory: Denies difficulty breathing, shortness of breath, cough or sputum production.   Cardiovascular: Denies chest pain, chest tightness, palpitations or swelling in the hands or feet.  Gastrointestinal: Denies abdominal pain, bloating, constipation, diarrhea or blood in the stool.  GU: Denies urgency, frequency, pain with urination, burning sensation, blood in urine, odor or discharge. Musculoskeletal: Denies decrease in range of motion, difficulty with gait, muscle pain or joint pain and  swelling.  Skin: Denies redness, rashes, lesions or ulcercations.  Neurological: Denies dizziness, difficulty with memory, difficulty with speech or problems with balance and coordination.  Psych: Denies anxiety, depression, SI/HI.  No other specific complaints in a complete review of systems (except as listed in HPI above).      Objective:   Physical Exam  There were no vitals taken for this visit. Wt Readings from Last 3 Encounters:  06/23/20 201 lb 9.6 oz (91.4 kg)  09/17/18 168 lb (76.2 kg)  09/16/18 166 lb 6.4 oz (75.5 kg)    General: Appears their stated age, well developed, well nourished in NAD. Skin: Warm, dry and intact. No rashes, lesions or ulcerations noted. HEENT: Head: normal shape and size; Eyes: sclera white, no icterus, conjunctiva pink, PERRLA and EOMs intact; Ears: Tm's gray and intact, normal light reflex; Nose: mucosa pink and moist, septum midline; Throat/Mouth: Teeth present, mucosa pink and moist, no exudate, lesions or ulcerations noted.  Neck:  Neck supple, trachea midline. No masses, lumps or thyromegaly present.  Cardiovascular: Normal rate and rhythm. S1,S2 noted.  No murmur, rubs or gallops noted. No JVD or BLE edema. No carotid bruits noted. Pulmonary/Chest: Normal effort and positive vesicular breath sounds. No respiratory distress. No wheezes, rales or ronchi noted.  Abdomen: Soft and nontender. Normal bowel sounds. No distention or masses noted. Liver, spleen and kidneys non palpable. Musculoskeletal: Normal range of motion. No signs of joint swelling. No difficulty with gait.  Neurological: Alert and oriented. Cranial nerves II-XII grossly intact. Coordination normal.  Psychiatric: Mood and affect normal. Behavior is normal. Judgment and thought content normal.    BMET    Component Value Date/Time   NA 140 03/04/2017 1015   NA 138 05/05/2014 0545   K 4.0 03/04/2017 1015   K 3.5 05/05/2014 0545   CL 106 03/04/2017 1015   CL 104 05/05/2014 0545    CO2 24 03/04/2017 1015   CO2 28 05/05/2014 0545   GLUCOSE 167 (H) 03/04/2017 1015   GLUCOSE 136 (H) 05/05/2014 0545   BUN 20 03/04/2017 1015   BUN 6 (L) 05/05/2014 0545   CREATININE 1.19 03/04/2017 1015   CREATININE 1.08 05/05/2014 0545   CALCIUM 9.5 03/04/2017 1015   CALCIUM 8.0 (L) 05/05/2014 0545   GFRNONAA >60 03/04/2017 1015   GFRNONAA >60 05/05/2014 0545   GFRAA >60 03/04/2017 1015   GFRAA >60 05/05/2014 0545    Lipid Panel  No results found for: CHOL, TRIG, HDL, CHOLHDL, VLDL, LDLCALC  CBC    Component Value Date/Time   WBC 11.0 (H) 04/18/2017 0830   WBC 13.1 (H) 03/04/2017 1015   RBC 5.76 04/18/2017 0830   RBC 5.66 03/04/2017 1015   HGB 17.2 04/18/2017 0830   HCT 51.1 (H) 04/18/2017 0830   PLT 361  04/18/2017 0830   MCV 89 04/18/2017 0830   MCV 91 05/01/2014 0629   MCH 29.9 04/18/2017 0830   MCH 29.9 03/04/2017 1015   MCHC 33.7 04/18/2017 0830   MCHC 34.1 03/04/2017 1015   RDW 13.9 04/18/2017 0830   RDW 13.7 05/01/2014 0629   LYMPHSABS 2.8 04/18/2017 0830   EOSABS 0.5 (H) 04/18/2017 0830   BASOSABS 0.0 04/18/2017 0830    Hgb A1C No results found for: HGBA1C          Assessment & Plan:    RTC in 1 year for your annual exam Angeline Laura, NP

## 2024-04-08 DIAGNOSIS — D500 Iron deficiency anemia secondary to blood loss (chronic): Secondary | ICD-10-CM | POA: Diagnosis not present

## 2024-04-08 DIAGNOSIS — R748 Abnormal levels of other serum enzymes: Secondary | ICD-10-CM | POA: Diagnosis not present

## 2024-04-08 DIAGNOSIS — K50111 Crohn's disease of large intestine with rectal bleeding: Secondary | ICD-10-CM | POA: Diagnosis not present

## 2024-04-08 DIAGNOSIS — D126 Benign neoplasm of colon, unspecified: Secondary | ICD-10-CM | POA: Diagnosis not present

## 2024-04-08 DIAGNOSIS — Z23 Encounter for immunization: Secondary | ICD-10-CM | POA: Diagnosis not present

## 2024-04-08 DIAGNOSIS — Z79899 Other long term (current) drug therapy: Secondary | ICD-10-CM | POA: Diagnosis not present

## 2024-07-02 DIAGNOSIS — K50111 Crohn's disease of large intestine with rectal bleeding: Secondary | ICD-10-CM | POA: Diagnosis not present

## 2024-07-02 DIAGNOSIS — Z79899 Other long term (current) drug therapy: Secondary | ICD-10-CM | POA: Diagnosis not present
